# Patient Record
Sex: Male | Born: 1960 | Race: White | Hispanic: No | Marital: Single | Smoking: Current some day smoker
Health system: Southern US, Community
[De-identification: ages and names within clinical notes are randomized; demographics above are authoritative.]

## PROBLEM LIST (undated history)

## (undated) DIAGNOSIS — K5190 Ulcerative colitis, unspecified, without complications: Secondary | ICD-10-CM

## (undated) HISTORY — PX: INGUINAL HERNIA REPAIR: SUR1180

## (undated) HISTORY — PX: WISDOM TOOTH EXTRACTION: SHX21

---

## 1998-04-27 ENCOUNTER — Ambulatory Visit (HOSPITAL_BASED_OUTPATIENT_CLINIC_OR_DEPARTMENT_OTHER): Admission: RE | Admit: 1998-04-27 | Discharge: 1998-04-27 | Payer: Self-pay | Admitting: General Surgery

## 1999-08-03 ENCOUNTER — Encounter: Admission: RE | Admit: 1999-08-03 | Discharge: 1999-08-03 | Payer: Self-pay | Admitting: *Deleted

## 1999-08-03 ENCOUNTER — Encounter: Payer: Self-pay | Admitting: *Deleted

## 2013-04-03 ENCOUNTER — Ambulatory Visit (INDEPENDENT_AMBULATORY_CARE_PROVIDER_SITE_OTHER): Payer: BC Managed Care – PPO | Admitting: Emergency Medicine

## 2013-04-03 VITALS — BP 122/74 | HR 60 | Temp 98.0°F | Resp 17 | Ht 69.0 in | Wt 171.0 lb

## 2013-04-03 DIAGNOSIS — R361 Hematospermia: Secondary | ICD-10-CM

## 2013-04-03 DIAGNOSIS — S335XXA Sprain of ligaments of lumbar spine, initial encounter: Secondary | ICD-10-CM

## 2013-04-03 LAB — POCT UA - MICROSCOPIC ONLY
Bacteria, U Microscopic: NEGATIVE
Casts, Ur, LPF, POC: NEGATIVE
Crystals, Ur, HPF, POC: NEGATIVE
Epithelial cells, urine per micros: NEGATIVE
Mucus, UA: NEGATIVE
RBC, urine, microscopic: NEGATIVE
WBC, Ur, HPF, POC: NEGATIVE
Yeast, UA: NEGATIVE

## 2013-04-03 LAB — POCT CBC
Granulocyte percent: 61.3 %G (ref 37–80)
HCT, POC: 41.3 % — AB (ref 43.5–53.7)
Hemoglobin: 13 g/dL — AB (ref 14.1–18.1)
Lymph, poc: 2.3 (ref 0.6–3.4)
MCH, POC: 30.5 pg (ref 27–31.2)
MCHC: 31.5 g/dL — AB (ref 31.8–35.4)
MCV: 96.9 fL (ref 80–97)
MID (cbc): 0.5 (ref 0–0.9)
MPV: 8.8 fL (ref 0–99.8)
POC Granulocyte: 4.5 (ref 2–6.9)
POC LYMPH PERCENT: 31.5 %L (ref 10–50)
POC MID %: 7.2 %M (ref 0–12)
Platelet Count, POC: 183 10*3/uL (ref 142–424)
RBC: 4.26 M/uL — AB (ref 4.69–6.13)
RDW, POC: 12.6 %
WBC: 7.4 10*3/uL (ref 4.6–10.2)

## 2013-04-03 LAB — POCT URINALYSIS DIPSTICK
Bilirubin, UA: NEGATIVE
Blood, UA: NEGATIVE
Glucose, UA: NEGATIVE
Ketones, UA: NEGATIVE
Leukocytes, UA: NEGATIVE
Nitrite, UA: NEGATIVE
Protein, UA: NEGATIVE
Spec Grav, UA: 1.01
Urobilinogen, UA: 0.2
pH, UA: 7

## 2013-04-03 LAB — PSA: PSA: 1.74 ng/mL (ref <=4.00)

## 2013-04-03 MED ORDER — NAPROXEN SODIUM 550 MG PO TABS: 550.0000 mg | ORAL_TABLET | Freq: Two times a day (BID) | ORAL | Status: AC

## 2013-04-03 NOTE — Patient Instructions (Addendum)
Lumbosacral Strain Lumbosacral strain is one of the most common causes of back pain. There are many causes of back pain. Most are not serious conditions. CAUSES  Your backbone (spinal column) is made up of 24 main vertebral bodies, the sacrum, and the coccyx. These are held together by muscles and tough, fibrous tissue (ligaments). Nerve roots pass through the openings between the vertebrae. A sudden move or injury to the back may cause injury to, or pressure on, these nerves. This may result in localized back pain or pain movement (radiation) into the buttocks, down the leg, and into the foot. Sharp, shooting pain from the buttock down the back of the leg (sciatica) is frequently associated with a ruptured (herniated) disk. Pain may be caused by muscle spasm alone. Your caregiver can often find the cause of your pain by the details of your symptoms and an exam. In some cases, you may need tests (such as X-rays). Your caregiver will work with you to decide if any tests are needed based on your specific exam. HOME CARE INSTRUCTIONS   Avoid an underactive lifestyle. Active exercise, as directed by your caregiver, is your greatest weapon against back pain.  Avoid hard physical activities (tennis, racquetball, waterskiing) if you are not in proper physical condition for it. This may aggravate or create problems.  If you have a back problem, avoid sports requiring sudden body movements. Swimming and walking are generally safer activities.  Maintain good posture.  Avoid becoming overweight (obese).  Use bed rest for only the most extreme, sudden (acute) episode. Your caregiver will help you determine how much bed rest is necessary.  For acute conditions, you may put ice on the injured area.  Put ice in a plastic bag.  Place a towel between your skin and the bag.  Leave the ice on for 15-20 minutes at a time, every 2 hours, or as needed.  After you are improved and more active, it may help to  apply heat for 30 minutes before activities. See your caregiver if you are having pain that lasts longer than expected. Your caregiver can advise appropriate exercises or therapy if needed. With conditioning, most back problems can be avoided. SEEK IMMEDIATE MEDICAL CARE IF:   You have numbness, tingling, weakness, or problems with the use of your arms or legs.  You experience severe back pain not relieved with medicines.  There is a change in bowel or bladder control.  You have increasing pain in any area of the body, including your belly (abdomen).  You notice shortness of breath, dizziness, or feel faint.  You feel sick to your stomach (nauseous), are throwing up (vomiting), or become sweaty.  You notice discoloration of your toes or legs, or your feet get very cold.  Your back pain is getting worse.  You have a fever. MAKE SURE YOU:   Understand these instructions.  Will watch your condition.  Will get help right away if you are not doing well or get worse. Document Released: 06/12/2005 Document Revised: 11/25/2011 Document Reviewed: 12/02/2008 [facility] Patient Information 2014 [place], [place].

## 2013-04-03 NOTE — Progress Notes (Signed)
[facility] [address] [place] [address] [phone]  Date:  04/03/2013   Name:  Zachary Horn   DOB:  [birth year]   MRN:  [id number]  PCP:  No primary provider on file.    Chief Complaint: Back Pain and Nausea   History of Present Illness:  Zachary Horn is a 52 y.o. very pleasant male patient who presents with the following:  Noticed blood in semen a couple days ago.  Has low back pain and generalized malaise and muscle aches.  No fever or  Chills.  Some hesitancy and dribbling no dysuria or urgency.  Has frequent urination.  Normal stream.  Low back pain for past several weeks.  Not radiating and is located across lower back.    There are no active problems to display for this patient.   History reviewed. No pertinent past medical history.  History reviewed. No pertinent past surgical history.  History  Substance Use Topics  . Smoking status: Never Smoker   . Smokeless tobacco: Not on file  . Alcohol Use: No    History reviewed. No pertinent family history.  No Known Allergies  Medication list has been reviewed and updated.  No current outpatient prescriptions on file prior to visit.   No current facility-administered medications on file prior to visit.    Review of Systems:  As per HPI, otherwise negative.    Physical Examination: Filed Vitals:   04/03/13 1007  BP: 122/74  Pulse: 60  Temp: 98 F (36.7 C)  Resp: 17   Filed Vitals:   04/03/13 1007  Height: 5' 9"  (1.753 m)  Weight: 171 lb (77.565 kg)   Body mass index is 25.24 kg/(m^2). Ideal Body Weight: Weight in (lb) to have BMI = 25: 168.9   GEN: WDWN, NAD, Non-toxic, Alert & Oriented x 3 HEENT: Atraumatic, Normocephalic.  Ears and Nose: No external deformity. EXTR: No clubbing/cyanosis/edema NEURO: Normal gait.  PSYCH: Normally interactive. Conversant. Not depressed or anxious appearing.  Calm demeanor.  BACK  Lumbar tenderness   Assessment and  Plan: Hematospermia Lumbar strain Anaprox Flexeril   Signed,  Ellison Carwin, MD   Results for orders placed in visit on 04/03/13  POCT CBC      Result Value Range   WBC 7.4  4.6 - 10.2 K/uL   Lymph, poc 2.3  0.6 - 3.4   POC LYMPH PERCENT 31.5  10 - 50 %L   MID (cbc) 0.5  0 - 0.9   POC MID % 7.2  0 - 12 %M   POC Granulocyte 4.5  2 - 6.9   Granulocyte percent 61.3  37 - 80 %G   RBC 4.26 (*) 4.69 - 6.13 M/uL   Hemoglobin 13.0 (*) 14.1 - 18.1 g/dL   HCT, POC 41.3 (*) 43.5 - 53.7 %   MCV 96.9  80 - 97 fL   MCH, POC 30.5  27 - 31.2 pg   MCHC 31.5 (*) 31.8 - 35.4 g/dL   RDW, POC 12.6     Platelet Count, POC 183  142 - 424 K/uL   MPV 8.8  0 - 99.8 fL  POCT URINALYSIS DIPSTICK      Result Value Range   Color, UA yellow     Clarity, UA clear     Glucose, UA neg     Bilirubin, UA neg     Ketones, UA neg     Spec Grav, UA 1.010  Blood, UA neg     pH, UA 7.0     Protein, UA neg     Urobilinogen, UA 0.2     Nitrite, UA neg     Leukocytes, UA Negative    POCT UA - MICROSCOPIC ONLY      Result Value Range   WBC, Ur, HPF, POC neg     RBC, urine, microscopic neg     Bacteria, U Microscopic neg     Mucus, UA neg     Epithelial cells, urine per micros neg     Crystals, Ur, HPF, POC neg     Casts, Ur, LPF, POC neg     Yeast, UA neg

## 2013-05-01 ENCOUNTER — Ambulatory Visit (INDEPENDENT_AMBULATORY_CARE_PROVIDER_SITE_OTHER): Payer: BC Managed Care – PPO | Admitting: Family Medicine

## 2013-05-01 VITALS — BP 96/68 | HR 62 | Temp 98.6°F | Resp 16 | Ht 68.5 in | Wt 162.4 lb

## 2013-05-01 DIAGNOSIS — S91009A Unspecified open wound, unspecified ankle, initial encounter: Secondary | ICD-10-CM

## 2013-05-01 DIAGNOSIS — S81009A Unspecified open wound, unspecified knee, initial encounter: Secondary | ICD-10-CM

## 2013-05-01 DIAGNOSIS — M79609 Pain in unspecified limb: Secondary | ICD-10-CM

## 2013-05-01 DIAGNOSIS — Z23 Encounter for immunization: Secondary | ICD-10-CM

## 2013-05-01 DIAGNOSIS — S81801A Unspecified open wound, right lower leg, initial encounter: Secondary | ICD-10-CM

## 2013-05-01 NOTE — Patient Instructions (Addendum)

## 2013-05-01 NOTE — Progress Notes (Signed)
Procedure Note: Verbal consent obtained from the patient.  Local anesthesia with 2 cc Lidocaine 2% with epinephrine.  Wound scrubbed with soap and water.  Wound explored.  No foreign bodies or deep structure injury noted.  Wound closed with 5 sutures of 4-0 Prolene (#1 HM  + #4 SI).  Area cleansed and dressed.  Wound care discussed.  Pt tolerated very well.  Anticipate suture removal in 10 days.   Patient comes in with lacerated right foot after having cut it on a piece of scrap aluminum one half hours prior to arriving. He's unsure of his last tetanus shot.  Objective: Patient has 2 cm irregular laceration over the anterior surface of his right ankle. There is no involvement of vessels, tendons, or bone. He has full range of motion of his foot.  Please see procedure note above  Assessment: Simple laceration  Plan: Pain in limb  Wound of right leg, initial encounter - Plan: Tdap vaccine greater than or equal to 7yo IMreturn in 7 days for suture removal  Signed, Robyn Haber, MD

## 2013-05-12 ENCOUNTER — Ambulatory Visit (INDEPENDENT_AMBULATORY_CARE_PROVIDER_SITE_OTHER): Payer: BC Managed Care – PPO | Admitting: Physician Assistant

## 2013-05-12 VITALS — BP 98/60 | HR 60 | Temp 98.8°F | Resp 18 | Ht 68.5 in | Wt 166.0 lb

## 2013-05-12 DIAGNOSIS — Z4802 Encounter for removal of sutures: Secondary | ICD-10-CM

## 2013-05-12 NOTE — Progress Notes (Signed)
  Subjective:    Patient ID: Zachary Horn, male    DOB: Jun 30, 1961, 52 y.o.   MRN: [id number]  HPI   Zachary Horn is a very pleasant 52 yr old male here for suture removal.  Reports he is doing well.  No concerns.    Review of Systems  All other systems reviewed and are negative.       Objective:   Physical Exam  Vitals reviewed. Constitutional: He is oriented to person, place, and time. He appears well-developed and well-nourished. No distress.  HENT:  Head: Normocephalic and atraumatic.  Eyes: Conjunctivae are normal. No scleral icterus.  Pulmonary/Chest: Effort normal.  Neurological: He is alert and oriented to person, place, and time.  Skin: Skin is warm and dry.     Healing laceration of right ankle; sutures removed with minimal difficulty though one suture was scabbed over; mupirocin and bandage applied        Assessment & Plan:  Visit for suture removal   Zachary Horn is a very pleasant 52 yr old male here for suture removal.  Sutures removed as above.  Continue daily dressing until completely healed.  Follow up prn

## 2016-03-06 DIAGNOSIS — J301 Allergic rhinitis due to pollen: Secondary | ICD-10-CM | POA: Diagnosis not present

## 2016-06-13 DIAGNOSIS — Z0000 Encounter for general adult medical examination without abnormal findings: Secondary | ICD-10-CM | POA: Diagnosis not present

## 2016-06-13 DIAGNOSIS — Z125 Encounter for screening for malignant neoplasm of prostate: Secondary | ICD-10-CM | POA: Diagnosis not present

## 2016-06-20 DIAGNOSIS — J45909 Unspecified asthma, uncomplicated: Secondary | ICD-10-CM | POA: Diagnosis not present

## 2016-06-20 DIAGNOSIS — R740 Nonspecific elevation of levels of transaminase and lactic acid dehydrogenase [LDH]: Secondary | ICD-10-CM | POA: Diagnosis not present

## 2016-06-20 DIAGNOSIS — Z0000 Encounter for general adult medical examination without abnormal findings: Secondary | ICD-10-CM | POA: Diagnosis not present

## 2016-07-19 ENCOUNTER — Ambulatory Visit: Payer: Self-pay

## 2016-07-19 ENCOUNTER — Encounter: Payer: Self-pay | Admitting: Sports Medicine

## 2016-07-19 ENCOUNTER — Ambulatory Visit (INDEPENDENT_AMBULATORY_CARE_PROVIDER_SITE_OTHER): Payer: BLUE CROSS/BLUE SHIELD | Admitting: Sports Medicine

## 2016-07-19 VITALS — BP 116/90 | HR 50 | Ht 69.0 in | Wt 175.0 lb

## 2016-07-19 DIAGNOSIS — M79671 Pain in right foot: Secondary | ICD-10-CM

## 2016-07-19 NOTE — Assessment & Plan Note (Signed)
It is possible that his pain could be associated with early plantar fasciitis versus a bone contusion of his calcaneus. - Placed in size 10-11 greens sport insoles today with a medium scaphoid pad - He was also given a midfoot strap. - He is to follow-up in 3-4 weeks. If there is improvement of his symptoms could consider custom orthotics at that time.

## 2016-07-19 NOTE — Progress Notes (Signed)
  Zachary Horn - 55 y.o. male MRN [id number]  Date of birth: June 23, 1961  SUBJECTIVE:  Including CC & ROS.  Chief Complaint  Patient presents with  . Foot Pain   Zachary Horn is a 55 year old male that is presenting with right heel pain. He reports this pain started about 4 weeks ago. He denies any inciting event. He feels the pain when he is close to the end of the 3 mile run. He denies pain that is significantly with the first few steps in the morning. He denies any swelling or bruising. He describes the pain as mild in nature. He has taken ibuprofen for the pain. He denies any prior surgery on his foot. The pain is localized. He has not changed his shoes, tried new running surfaces, or increased his mileage. He reports a distant history of trauma to his right heel and it feels similar to that.  ROS: No unexpected weight loss, fever, chills, swelling, instability, muscle pain, numbness/tingling, redness, otherwise see HPI    HISTORY: Past Medical, Surgical, Social, and Family History Reviewed & Updated per EMR.   Pertinent Historical Findings include: PMSHx -  Hernia repair  PSHx -  Smoke cigars occasionally. Painter  FHx -  None  Medications - none   DATA REVIEWED: None to review   PHYSICAL EXAM:  VS: BP:116/90  HR:(!) 50bpm  TEMP: ( )  RESP:   HT:5' 9"  (175.3 cm)   WT:175 lb (79.4 kg)  BMI:25.9 PHYSICAL EXAM: Gen: NAD, alert, cooperative with exam, well-appearing HEENT: clear conjunctiva, EOMI CV:  no edema, capillary refill brisk,  Resp: non-labored, normal speech Skin: no rashes, normal turgor  Neuro: no gross deficits.  Psych:  alert and oriented Right foot: Tenderness to palpation over the medial calcaneus. No overlying ecchymosis or swelling. No tenderness palpation of the insertion of the acuities. No tears palpation of the tarsal tunnel. Normal range of motion. Normal strength. Negative calcaneal squeeze. The transverse and longitudinal arch seem preserved. No  abnormal calcification formation on his plantar aspect. Gait: He has a fairly neutral running gait and lands on his midfoot  Limited ultrasound: Right foot: The calcaneus did not reveal any hypervascularity to suggest a stress fracture. There did not show a significant spur of the calcaneus. The plantar fascia appears to be normal width at 0.5 mm at the origin. The achilless tendon had some calcific changes near the insertion. These findings are consistent with a normal exam.  ASSESSMENT & PLAN:   Pain of right heel It is possible that his pain could be associated with early plantar fasciitis versus a bone contusion of his calcaneus. - Placed in size 10-11 greens sport insoles today with a medium scaphoid pad - He was also given a midfoot strap. - He is to follow-up in 3-4 weeks. If there is improvement of his symptoms could consider custom orthotics at that time.

## 2016-08-15 DIAGNOSIS — K649 Unspecified hemorrhoids: Secondary | ICD-10-CM | POA: Diagnosis not present

## 2016-08-19 ENCOUNTER — Ambulatory Visit (INDEPENDENT_AMBULATORY_CARE_PROVIDER_SITE_OTHER): Payer: BLUE CROSS/BLUE SHIELD | Admitting: Sports Medicine

## 2016-08-19 ENCOUNTER — Other Ambulatory Visit: Payer: Self-pay | Admitting: *Deleted

## 2016-08-19 ENCOUNTER — Encounter: Payer: Self-pay | Admitting: Sports Medicine

## 2016-08-19 VITALS — BP 104/74 | HR 55 | Ht 68.0 in | Wt 170.0 lb

## 2016-08-19 DIAGNOSIS — M79671 Pain in right foot: Secondary | ICD-10-CM

## 2016-08-19 NOTE — Progress Notes (Signed)
   Subjective:    Patient ID: Zachary Horn, male    DOB: 1961-04-06, 55 y.o.   MRN: [id number]  HPI    Patient comes in today for follow-up on right heel pain. He has improved quite a bit. He likes his green sports insoles. He has been using his midfoot strap and icing after activity. He has been able to return to running and golfing with very little pain. He is happy with his progress to date.    Review of Systems     Objective:   Physical Exam  Well-developed, well-nourished. No acute distress.  Right foot: There is no tenderness to palpation at the origin of the plantar fascia. Negative calcaneal squeeze. Neurovascularly intact distally. Walking without a limp.      Assessment & Plan:   Resolving right heel pain secondary to early plantar fasciitis versus possible bone contusion  Patient is doing very well. He may discontinue his arch strap if he would like. However, he needs to continue with his green sports insoles and scaphoid pads. We had previously discussed custom orthotics but the patient is doing so well currently that we will wait on those for now. If he finds that he is wearing through his green sports insoles then we could reconsider custom orthotics at a later date. He may continue to increase his activity as tolerated and will follow-up with me as needed.

## 2016-10-23 DIAGNOSIS — K648 Other hemorrhoids: Secondary | ICD-10-CM | POA: Diagnosis not present

## 2016-10-23 DIAGNOSIS — K644 Residual hemorrhoidal skin tags: Secondary | ICD-10-CM | POA: Diagnosis not present

## 2017-05-12 DIAGNOSIS — K529 Noninfective gastroenteritis and colitis, unspecified: Secondary | ICD-10-CM | POA: Diagnosis not present

## 2017-05-28 DIAGNOSIS — R197 Diarrhea, unspecified: Secondary | ICD-10-CM | POA: Diagnosis not present

## 2017-07-10 DIAGNOSIS — Z125 Encounter for screening for malignant neoplasm of prostate: Secondary | ICD-10-CM | POA: Diagnosis not present

## 2017-07-10 DIAGNOSIS — Z0000 Encounter for general adult medical examination without abnormal findings: Secondary | ICD-10-CM | POA: Diagnosis not present

## 2017-07-17 DIAGNOSIS — Z0000 Encounter for general adult medical examination without abnormal findings: Secondary | ICD-10-CM | POA: Diagnosis not present

## 2017-07-17 DIAGNOSIS — Z23 Encounter for immunization: Secondary | ICD-10-CM | POA: Diagnosis not present

## 2017-07-17 DIAGNOSIS — Z87448 Personal history of other diseases of urinary system: Secondary | ICD-10-CM | POA: Diagnosis not present

## 2017-07-17 DIAGNOSIS — R319 Hematuria, unspecified: Secondary | ICD-10-CM | POA: Diagnosis not present

## 2018-02-18 DIAGNOSIS — K4090 Unilateral inguinal hernia, without obstruction or gangrene, not specified as recurrent: Secondary | ICD-10-CM | POA: Diagnosis not present

## 2018-02-18 DIAGNOSIS — J45909 Unspecified asthma, uncomplicated: Secondary | ICD-10-CM | POA: Diagnosis not present

## 2018-02-23 ENCOUNTER — Ambulatory Visit: Payer: BLUE CROSS/BLUE SHIELD | Admitting: Sports Medicine

## 2018-02-23 ENCOUNTER — Encounter: Payer: Self-pay | Admitting: Sports Medicine

## 2018-02-23 VITALS — BP 101/69 | Ht 69.0 in | Wt 160.0 lb

## 2018-02-23 DIAGNOSIS — S39011A Strain of muscle, fascia and tendon of abdomen, initial encounter: Secondary | ICD-10-CM | POA: Diagnosis not present

## 2018-02-24 DIAGNOSIS — K529 Noninfective gastroenteritis and colitis, unspecified: Secondary | ICD-10-CM | POA: Diagnosis not present

## 2018-02-24 NOTE — Progress Notes (Signed)
   Subjective:    Patient ID: Zachary Horn, male    DOB: 09-Nov-1960, 57 y.o.   MRN: [id number]  HPI chief complaint: Right lower abdominal pain  Very pleasant 57 year old male comes in today with concerns about a new hernia. Patient had a right-sided hernia repair 2 years ago and was doing well up until he was moving a desk 2 weeks ago. Felt a pulling sensation in the right inguinal area and is now concerned that he may have a new hernia. He has not noticed any bulging. He does endorse pain with any sort of activity that requires him to contract his lower abdominal musculature. He get some radiating pain to the back as well.He enjoys running and has been able to run with minimal discomfort. He has an appointment with his primary care physician's office later this week.  Interm will history reviewed Medications reviewed Allergies reviewed    Review of Systems As above    Objective:   Physical Exam  Well-developed, well-nourished. No acute distress. Awake alert and oriented 3. Vital signs reviewed  Abdominal exam does not show any palpable defect in the lower abdominal wall. There is tenderness to palpation along the right lower rectus abdominous with reproducible pain with resisted sit up. Genitalia exam does not reveal evidence of hernia.      Assessment & Plan:   Right-sided lower abdominal pain likely secondary to muscular strain  Although I do not see any obvious evidence of a hernia, I've encouraged him to see his primary care physician later this week as scheduled. Patient understands. I think he is safe to continue with activity as tolerated, including running. Will follow-up with me as needed.

## 2018-02-26 DIAGNOSIS — R197 Diarrhea, unspecified: Secondary | ICD-10-CM | POA: Diagnosis not present

## 2018-02-26 DIAGNOSIS — K529 Noninfective gastroenteritis and colitis, unspecified: Secondary | ICD-10-CM | POA: Diagnosis not present

## 2018-03-13 ENCOUNTER — Emergency Department (HOSPITAL_COMMUNITY)
Admission: EM | Admit: 2018-03-13 | Discharge: 2018-03-13 | Disposition: A | Discharge status: Home / Self Care | Payer: BLUE CROSS/BLUE SHIELD | Attending: Emergency Medicine | Admitting: Emergency Medicine

## 2018-03-13 ENCOUNTER — Encounter (HOSPITAL_COMMUNITY): Payer: Self-pay

## 2018-03-13 ENCOUNTER — Other Ambulatory Visit: Payer: Self-pay

## 2018-03-13 DIAGNOSIS — G479 Sleep disorder, unspecified: Secondary | ICD-10-CM | POA: Insufficient documentation

## 2018-03-13 DIAGNOSIS — R197 Diarrhea, unspecified: Secondary | ICD-10-CM | POA: Insufficient documentation

## 2018-03-13 DIAGNOSIS — K529 Noninfective gastroenteritis and colitis, unspecified: Secondary | ICD-10-CM | POA: Diagnosis not present

## 2018-03-13 LAB — I-STAT CHEM 8, ED
BUN: 5 mg/dL — ABNORMAL LOW (ref 6–20)
CHLORIDE: 87 mmol/L — AB (ref 98–111)
CREATININE: 0.8 mg/dL (ref 0.61–1.24)
Calcium, Ion: 1.07 mmol/L — ABNORMAL LOW (ref 1.15–1.40)
GLUCOSE: 152 mg/dL — AB (ref 70–99)
HCT: 33 % — ABNORMAL LOW (ref 39.0–52.0)
Hemoglobin: 11.2 g/dL — ABNORMAL LOW (ref 13.0–17.0)
POTASSIUM: 3 mmol/L — AB (ref 3.5–5.1)
Sodium: 128 mmol/L — ABNORMAL LOW (ref 135–145)
TCO2: 28 mmol/L (ref 22–32)

## 2018-03-13 LAB — CBC WITH DIFFERENTIAL/PLATELET
BASOS ABS: 0.1 10*3/uL (ref 0.0–0.1)
Basophils Relative: 1 %
Eosinophils Absolute: 0.2 10*3/uL (ref 0.0–0.7)
Eosinophils Relative: 2 %
HEMATOCRIT: 30.8 % — AB (ref 39.0–52.0)
HEMOGLOBIN: 10.8 g/dL — AB (ref 13.0–17.0)
LYMPHS PCT: 17 %
Lymphs Abs: 2 10*3/uL (ref 0.7–4.0)
MCH: 31 pg (ref 26.0–34.0)
MCHC: 35.1 g/dL (ref 30.0–36.0)
MCV: 88.5 fL (ref 78.0–100.0)
MONO ABS: 1.6 10*3/uL (ref 0.1–1.0)
MONOS PCT: 13 %
NEUTROS ABS: 7.9 10*3/uL (ref 1.7–7.7)
Neutrophils Relative %: 67 %
Platelets: 600 10*3/uL — ABNORMAL HIGH (ref 150–400)
RBC: 3.48 MIL/uL — ABNORMAL LOW (ref 4.22–5.81)
RDW: 12.2 % (ref 11.5–15.5)
WBC: 11.7 10*3/uL — ABNORMAL HIGH (ref 4.0–10.5)

## 2018-03-13 LAB — MAGNESIUM: MAGNESIUM: 2.1 mg/dL (ref 1.7–2.4)

## 2018-03-13 MED ORDER — LORAZEPAM 1 MG PO TABS: 1.0000 mg | ORAL_TABLET | Freq: Every day | ORAL | 0 refills | Status: DC

## 2018-03-13 MED ORDER — POTASSIUM CHLORIDE CRYS ER 20 MEQ PO TBCR
40.0000 meq | EXTENDED_RELEASE_TABLET | Freq: Once | ORAL | Status: AC
  Administered 2018-03-13: 40 meq via ORAL
  Filled 2018-03-13: qty 2

## 2018-03-13 MED ORDER — SODIUM CHLORIDE 0.9 % IV BOLUS
1000.0000 mL | Freq: Once | INTRAVENOUS | Status: AC
  Administered 2018-03-13: 1000 mL via INTRAVENOUS

## 2018-03-13 NOTE — ED Provider Notes (Signed)
[facility] DEPT Provider Note   CSN: [id number] Arrival date & time: 03/13/18  0503     History   Chief Complaint Chief Complaint  Patient presents with  . Insomnia  . Diarrhea    HPI Zachary Horn is a 57 y.o. male.  HPI 57 year old male comes in with chief complaint of diarrhea and insomnia.  Patient states that for the past 3 weeks he has been having diarrhea, about 15 episodes of loose bowel movements every day.  Patient has seen his PCP, and is supposed to see GI this Monday.  He states that he started taking Lomotil last week to help with his diarrhea.  He took the medication for 3 or 4 days and started noticing that he was having restlessness and difficulty in sleeping.  Patient stopped taking his medication 4 days ago, but he continues to have difficulty in sleeping, and when he is asleep he is having with dreams.  Patient denies any recent antibiotic use, travel hx.  There is no bloody stools.  Review of system is positive for jerking and cramping, the former specifically when he is trying to fall asleep.  Patient does not have any significant medical history and denies any substance abuse.   History reviewed. No pertinent past medical history.  Patient Active Problem List   Diagnosis Date Noted  . Pain of right heel 07/19/2016    History reviewed. No pertinent surgical history.      Home Medications    Prior to Admission medications   Medication Sig Start Date End Date Taking? Authorizing Provider  ibuprofen (ADVIL,MOTRIN) 200 MG tablet Take 200 mg by mouth every 6 (six) hours as needed for moderate pain.    Yes [provider]  LORazepam (ATIVAN) 1 MG tablet Take 1 tablet (1 mg total) by mouth at bedtime. 03/13/18   Varney Biles, MD    Family History History reviewed. No pertinent family history.  Social History Social History   Tobacco Use  . Smoking status: Never Smoker  . Smokeless tobacco: Never Used    Substance Use Topics  . Alcohol use: No  . Drug use: No     Allergies   Patient has no known allergies.   Review of Systems Review of Systems  Constitutional: Positive for activity change.  Respiratory: Negative for shortness of breath.   Cardiovascular: Negative for chest pain and palpitations.  Gastrointestinal: Positive for diarrhea.  Allergic/Immunologic: Negative for immunocompromised state.  All other systems reviewed and are negative.    Physical Exam Updated Vital Signs BP 101/73 (BP Location: Left Arm)   Pulse (!) 108   Temp 97.7 F (36.5 C) (Oral)   Resp 16   Ht 5' 9"  (1.753 m)   Wt 70.3 kg (155 lb)   SpO2 100%   BMI 22.89 kg/m   Physical Exam  Constitutional: He is oriented to person, place, and time. He appears well-developed.  HENT:  Head: Atraumatic.  Neck: Neck supple.  Cardiovascular: Normal rate.  Pulmonary/Chest: Effort normal.  Abdominal: Soft.  Neurological: He is alert and oriented to person, place, and time. He displays normal reflexes. No cranial nerve deficit or sensory deficit. Coordination normal.  Skin: Skin is warm.  Nursing note and vitals reviewed.    ED Treatments / Results  Labs (all labs ordered are listed, but only abnormal results are displayed) Labs Reviewed  CBC WITH DIFFERENTIAL/PLATELET - Abnormal; Notable for the following components:      Result Value  WBC 11.7 (*)    RBC 3.48 (*)    Hemoglobin 10.8 (*)    HCT 30.8 (*)    Platelets 600 (*)    All other components within normal limits  I-STAT CHEM 8, ED - Abnormal; Notable for the following components:   Sodium 128 (*)    Potassium 3.0 (*)    Chloride 87 (*)    BUN 5 (*)    Glucose, Bld 152 (*)    Calcium, Ion 1.07 (*)    Hemoglobin 11.2 (*)    HCT 33.0 (*)    All other components within normal limits  MAGNESIUM    EKG None  Radiology No results found.  Procedures Procedures (including critical care time)  Medications Ordered in  ED Medications  potassium chloride SA (K-DUR,KLOR-CON) CR tablet 40 mEq (has no administration in time range)  sodium chloride 0.9 % bolus 1,000 mL (has no administration in time range)  potassium chloride SA (K-DUR,KLOR-CON) CR tablet 40 mEq (has no administration in time range)     Initial Impression / Assessment and Plan / ED Course  I have reviewed the triage vital signs and the nursing notes.  Pertinent labs & imaging results that were available during my care of the patient were reviewed by me and considered in my medical decision making (see chart for details).     57 year old healthy male with 3 weeks of diarrhea, comes in complaining of insomnia.  Patient states that his symptoms started after he started taking Lomotil.  He has stopped taking the medication for the last 4 days, however continues to have insomnia.  Additionally he is having cramps.  On exam patient does not appear dehydrated.  Basic labs have been ordered and he has mild hyponatremia and mild hypokalemia.  Magnesium level is normal.  We will give him a normal saline bolus along with oral potassium.  Patient is to see his GI doctor soon, which is reassuring.  I doubt that patient's insomnia is because of Lomotil, because he stopped taking the medication 4 days ago and he only took the medication for 5 days total.  Diarrhea appears to be secretory in nature and not infectious.  Plan is for patient to continue seeing the GI doctor for his diarrhea and his PCP for further management.  We will give him few Ativan pills that he can take at nighttime if he is feeling restless and having difficulty in sleeping.  Patient aware that the work-up in the emergency room is limited to emergent conditions only, and that follow-up with PCP is prudent.  Final Clinical Impressions(s) / ED Diagnoses   Final diagnoses:  Sleep disturbance  Chronic diarrhea    ED Discharge Orders        Ordered    LORazepam (ATIVAN) 1 MG tablet   Daily at bedtime     03/13/18 0730       Varney Biles, MD 03/13/18 [phone]

## 2018-03-13 NOTE — ED Triage Notes (Signed)
Pt presents to ED from home for insomnia and diarrhea. Pt reports that he was put on lomotil for frequent diarrhea, but the medication has prevented him from sleeping since Sunday. Pt reports he feels very "jittery" and can't relax.

## 2018-03-13 NOTE — Discharge Instructions (Signed)
You were seen in the ER for sleep disturbance. We are sending you home with Ativan that you can take only at nighttime, if unable to sleep. We want to see your primary care doctor and the GI doctor for continued management for of diarrhea.  In the ER, your potassium was slightly low and we replaced it. Avoid taking any medications for diarrhea for now, and ask GI team to help you manage her diarrhea better.

## 2018-03-16 DIAGNOSIS — D649 Anemia, unspecified: Secondary | ICD-10-CM | POA: Diagnosis not present

## 2018-03-16 DIAGNOSIS — R110 Nausea: Secondary | ICD-10-CM | POA: Diagnosis not present

## 2018-03-16 DIAGNOSIS — R634 Abnormal weight loss: Secondary | ICD-10-CM | POA: Diagnosis not present

## 2018-03-16 DIAGNOSIS — R197 Diarrhea, unspecified: Secondary | ICD-10-CM | POA: Diagnosis not present

## 2018-03-18 DIAGNOSIS — K219 Gastro-esophageal reflux disease without esophagitis: Secondary | ICD-10-CM | POA: Diagnosis not present

## 2018-03-18 DIAGNOSIS — B379 Candidiasis, unspecified: Secondary | ICD-10-CM | POA: Diagnosis not present

## 2018-03-18 DIAGNOSIS — K5289 Other specified noninfective gastroenteritis and colitis: Secondary | ICD-10-CM | POA: Diagnosis not present

## 2018-03-18 DIAGNOSIS — R110 Nausea: Secondary | ICD-10-CM | POA: Diagnosis not present

## 2018-03-18 DIAGNOSIS — K222 Esophageal obstruction: Secondary | ICD-10-CM | POA: Diagnosis not present

## 2018-03-18 DIAGNOSIS — K2980 Duodenitis without bleeding: Secondary | ICD-10-CM | POA: Diagnosis not present

## 2018-03-18 DIAGNOSIS — K51011 Ulcerative (chronic) pancolitis with rectal bleeding: Secondary | ICD-10-CM | POA: Diagnosis not present

## 2018-03-18 DIAGNOSIS — K625 Hemorrhage of anus and rectum: Secondary | ICD-10-CM | POA: Diagnosis not present

## 2018-03-18 DIAGNOSIS — K2930 Chronic superficial gastritis without bleeding: Secondary | ICD-10-CM | POA: Diagnosis not present

## 2018-03-18 DIAGNOSIS — R634 Abnormal weight loss: Secondary | ICD-10-CM | POA: Diagnosis not present

## 2018-03-18 DIAGNOSIS — K228 Other specified diseases of esophagus: Secondary | ICD-10-CM | POA: Diagnosis not present

## 2018-03-18 DIAGNOSIS — K649 Unspecified hemorrhoids: Secondary | ICD-10-CM | POA: Diagnosis not present

## 2018-03-18 DIAGNOSIS — K229 Disease of esophagus, unspecified: Secondary | ICD-10-CM | POA: Diagnosis not present

## 2018-03-18 DIAGNOSIS — D509 Iron deficiency anemia, unspecified: Secondary | ICD-10-CM | POA: Diagnosis not present

## 2018-03-24 DIAGNOSIS — K51011 Ulcerative (chronic) pancolitis with rectal bleeding: Secondary | ICD-10-CM | POA: Diagnosis not present

## 2018-03-25 DIAGNOSIS — B379 Candidiasis, unspecified: Secondary | ICD-10-CM | POA: Diagnosis not present

## 2018-03-25 DIAGNOSIS — K2980 Duodenitis without bleeding: Secondary | ICD-10-CM | POA: Diagnosis not present

## 2018-03-25 DIAGNOSIS — K5289 Other specified noninfective gastroenteritis and colitis: Secondary | ICD-10-CM | POA: Diagnosis not present

## 2018-03-25 DIAGNOSIS — K51011 Ulcerative (chronic) pancolitis with rectal bleeding: Secondary | ICD-10-CM | POA: Diagnosis not present

## 2018-03-30 ENCOUNTER — Ambulatory Visit
Admission: RE | Admit: 2018-03-30 | Discharge: 2018-03-30 | Disposition: A | Discharge status: Home / Self Care | Payer: BLUE CROSS/BLUE SHIELD | Source: Ambulatory Visit | Attending: Gastroenterology | Admitting: Gastroenterology

## 2018-03-30 ENCOUNTER — Other Ambulatory Visit: Payer: Self-pay | Admitting: Gastroenterology

## 2018-03-30 DIAGNOSIS — K51011 Ulcerative (chronic) pancolitis with rectal bleeding: Secondary | ICD-10-CM

## 2018-03-30 DIAGNOSIS — R0602 Shortness of breath: Secondary | ICD-10-CM | POA: Diagnosis not present

## 2018-04-13 DIAGNOSIS — K51011 Ulcerative (chronic) pancolitis with rectal bleeding: Secondary | ICD-10-CM | POA: Diagnosis not present

## 2018-04-17 ENCOUNTER — Inpatient Hospital Stay (HOSPITAL_COMMUNITY)
Admission: EM | Admit: 2018-04-17 | Discharge: 2018-04-24 | DRG: 385 | Disposition: A | Discharge status: Home / Self Care | Payer: BLUE CROSS/BLUE SHIELD | Attending: Internal Medicine | Admitting: Internal Medicine

## 2018-04-17 ENCOUNTER — Inpatient Hospital Stay (HOSPITAL_COMMUNITY): Payer: BLUE CROSS/BLUE SHIELD

## 2018-04-17 ENCOUNTER — Encounter (HOSPITAL_COMMUNITY): Payer: Self-pay

## 2018-04-17 ENCOUNTER — Other Ambulatory Visit: Payer: Self-pay

## 2018-04-17 DIAGNOSIS — I951 Orthostatic hypotension: Secondary | ICD-10-CM | POA: Diagnosis not present

## 2018-04-17 DIAGNOSIS — R55 Syncope and collapse: Secondary | ICD-10-CM | POA: Diagnosis not present

## 2018-04-17 DIAGNOSIS — I82612 Acute embolism and thrombosis of superficial veins of left upper extremity: Secondary | ICD-10-CM | POA: Diagnosis not present

## 2018-04-17 DIAGNOSIS — K5100 Ulcerative (chronic) pancolitis without complications: Secondary | ICD-10-CM | POA: Diagnosis not present

## 2018-04-17 DIAGNOSIS — R0602 Shortness of breath: Secondary | ICD-10-CM | POA: Diagnosis not present

## 2018-04-17 DIAGNOSIS — E876 Hypokalemia: Secondary | ICD-10-CM | POA: Diagnosis not present

## 2018-04-17 DIAGNOSIS — R578 Other shock: Secondary | ICD-10-CM | POA: Diagnosis not present

## 2018-04-17 DIAGNOSIS — I9589 Other hypotension: Secondary | ICD-10-CM | POA: Diagnosis not present

## 2018-04-17 DIAGNOSIS — D649 Anemia, unspecified: Secondary | ICD-10-CM | POA: Diagnosis not present

## 2018-04-17 DIAGNOSIS — I82A11 Acute embolism and thrombosis of right axillary vein: Secondary | ICD-10-CM | POA: Diagnosis not present

## 2018-04-17 DIAGNOSIS — E440 Moderate protein-calorie malnutrition: Secondary | ICD-10-CM

## 2018-04-17 DIAGNOSIS — E871 Hypo-osmolality and hyponatremia: Secondary | ICD-10-CM | POA: Diagnosis not present

## 2018-04-17 DIAGNOSIS — R6510 Systemic inflammatory response syndrome (SIRS) of non-infectious origin without acute organ dysfunction: Secondary | ICD-10-CM | POA: Diagnosis present

## 2018-04-17 DIAGNOSIS — D62 Acute posthemorrhagic anemia: Secondary | ICD-10-CM | POA: Diagnosis present

## 2018-04-17 DIAGNOSIS — I340 Nonrheumatic mitral (valve) insufficiency: Secondary | ICD-10-CM | POA: Diagnosis not present

## 2018-04-17 DIAGNOSIS — R579 Shock, unspecified: Secondary | ICD-10-CM | POA: Diagnosis not present

## 2018-04-17 DIAGNOSIS — E861 Hypovolemia: Secondary | ICD-10-CM | POA: Diagnosis present

## 2018-04-17 DIAGNOSIS — R571 Hypovolemic shock: Secondary | ICD-10-CM | POA: Diagnosis not present

## 2018-04-17 DIAGNOSIS — R001 Bradycardia, unspecified: Secondary | ICD-10-CM | POA: Diagnosis not present

## 2018-04-17 DIAGNOSIS — E860 Dehydration: Secondary | ICD-10-CM | POA: Diagnosis not present

## 2018-04-17 DIAGNOSIS — R634 Abnormal weight loss: Secondary | ICD-10-CM

## 2018-04-17 DIAGNOSIS — K51018 Ulcerative (chronic) pancolitis with other complication: Secondary | ICD-10-CM | POA: Diagnosis not present

## 2018-04-17 DIAGNOSIS — K51011 Ulcerative (chronic) pancolitis with rectal bleeding: Secondary | ICD-10-CM

## 2018-04-17 DIAGNOSIS — K922 Gastrointestinal hemorrhage, unspecified: Secondary | ICD-10-CM | POA: Diagnosis not present

## 2018-04-17 DIAGNOSIS — Z6823 Body mass index (BMI) 23.0-23.9, adult: Secondary | ICD-10-CM | POA: Diagnosis not present

## 2018-04-17 DIAGNOSIS — R627 Adult failure to thrive: Secondary | ICD-10-CM

## 2018-04-17 DIAGNOSIS — R42 Dizziness and giddiness: Secondary | ICD-10-CM | POA: Diagnosis not present

## 2018-04-17 DIAGNOSIS — E2740 Unspecified adrenocortical insufficiency: Secondary | ICD-10-CM | POA: Diagnosis present

## 2018-04-17 DIAGNOSIS — K5190 Ulcerative colitis, unspecified, without complications: Secondary | ICD-10-CM | POA: Diagnosis not present

## 2018-04-17 DIAGNOSIS — E878 Other disorders of electrolyte and fluid balance, not elsewhere classified: Secondary | ICD-10-CM | POA: Diagnosis not present

## 2018-04-17 DIAGNOSIS — E43 Unspecified severe protein-calorie malnutrition: Secondary | ICD-10-CM

## 2018-04-17 DIAGNOSIS — R58 Hemorrhage, not elsewhere classified: Secondary | ICD-10-CM

## 2018-04-17 DIAGNOSIS — R609 Edema, unspecified: Secondary | ICD-10-CM | POA: Diagnosis not present

## 2018-04-17 DIAGNOSIS — R509 Fever, unspecified: Secondary | ICD-10-CM

## 2018-04-17 DIAGNOSIS — I959 Hypotension, unspecified: Secondary | ICD-10-CM | POA: Diagnosis not present

## 2018-04-17 HISTORY — DX: Ulcerative colitis, unspecified, without complications: K51.90

## 2018-04-17 LAB — CBC
HEMATOCRIT: 19.2 % — AB (ref 39.0–52.0)
HEMOGLOBIN: 6.2 g/dL — AB (ref 13.0–17.0)
MCH: 27.6 pg (ref 26.0–34.0)
MCHC: 32.3 g/dL (ref 30.0–36.0)
MCV: 85.3 fL (ref 78.0–100.0)
Platelets: 361 10*3/uL (ref 150–400)
RBC: 2.25 MIL/uL — AB (ref 4.22–5.81)
RDW: 13.4 % (ref 11.5–15.5)
WBC: 5.6 10*3/uL (ref 4.0–10.5)

## 2018-04-17 LAB — LACTIC ACID, PLASMA
LACTIC ACID, VENOUS: 1.7 mmol/L (ref 0.5–1.9)
Lactic Acid, Venous: 1.3 mmol/L (ref 0.5–1.9)

## 2018-04-17 LAB — URINALYSIS, ROUTINE W REFLEX MICROSCOPIC
Bilirubin Urine: NEGATIVE
GLUCOSE, UA: NEGATIVE mg/dL
Ketones, ur: NEGATIVE mg/dL
LEUKOCYTES UA: NEGATIVE
NITRITE: NEGATIVE
Protein, ur: NEGATIVE mg/dL
SPECIFIC GRAVITY, URINE: 1.003 — AB (ref 1.005–1.030)
pH: 6 (ref 5.0–8.0)

## 2018-04-17 LAB — PREPARE RBC (CROSSMATCH)

## 2018-04-17 LAB — COMPREHENSIVE METABOLIC PANEL
ALBUMIN: 1.7 g/dL — AB (ref 3.5–5.0)
ALT: 12 U/L (ref 0–44)
ANION GAP: 9 (ref 5–15)
AST: 15 U/L (ref 15–41)
Alkaline Phosphatase: 106 U/L (ref 38–126)
BILIRUBIN TOTAL: 0.6 mg/dL (ref 0.3–1.2)
BUN: 13 mg/dL (ref 6–20)
CALCIUM: 7.5 mg/dL — AB (ref 8.9–10.3)
CO2: 26 mmol/L (ref 22–32)
Chloride: 89 mmol/L — ABNORMAL LOW (ref 98–111)
Creatinine, Ser: 0.9 mg/dL (ref 0.61–1.24)
GFR calc Af Amer: >60 mL/min (ref >60)
GFR calc non Af Amer: >60 mL/min (ref >60)
GLUCOSE: 146 mg/dL — AB (ref 70–99)
Potassium: 4 mmol/L (ref 3.5–5.1)
SODIUM: 124 mmol/L — AB (ref 135–145)
TOTAL PROTEIN: 5.5 g/dL — AB (ref 6.5–8.1)

## 2018-04-17 LAB — HEMOGLOBIN AND HEMATOCRIT, BLOOD
HCT: 19.9 % — ABNORMAL LOW (ref 39.0–52.0)
HEMOGLOBIN: 6.7 g/dL — AB (ref 13.0–17.0)

## 2018-04-17 LAB — MRSA PCR SCREENING: MRSA by PCR: NEGATIVE

## 2018-04-17 LAB — POC OCCULT BLOOD, ED: Fecal Occult Bld: POSITIVE — AB

## 2018-04-17 LAB — MAGNESIUM: Magnesium: 1.7 mg/dL (ref 1.7–2.4)

## 2018-04-17 LAB — ABO/RH: ABO/RH(D): O POS

## 2018-04-17 LAB — PROCALCITONIN: Procalcitonin: 1.33 ng/mL

## 2018-04-17 MED ORDER — BOOST / RESOURCE BREEZE PO LIQD CUSTOM
1.0000 | Freq: Three times a day (TID) | ORAL | Status: DC
  Administered 2018-04-17 – 2018-04-20 (×7): 1 via ORAL

## 2018-04-17 MED ORDER — LACTATED RINGERS IV BOLUS
1000.0000 mL | Freq: Once | INTRAVENOUS | Status: AC
  Administered 2018-04-17: 1000 mL via INTRAVENOUS

## 2018-04-17 MED ORDER — AZATHIOPRINE 50 MG PO TABS
75.0000 mg | ORAL_TABLET | Freq: Every day | ORAL | Status: DC
  Administered 2018-04-18 – 2018-04-24 (×7): 75 mg via ORAL
  Filled 2018-04-17 (×7): qty 2

## 2018-04-17 MED ORDER — VANCOMYCIN HCL 10 G IV SOLR
1250.0000 mg | Freq: Once | INTRAVENOUS | Status: AC
  Administered 2018-04-17: 1250 mg via INTRAVENOUS
  Filled 2018-04-17: qty 1250

## 2018-04-17 MED ORDER — PIPERACILLIN-TAZOBACTAM 3.375 G IVPB
3.3750 g | Freq: Three times a day (TID) | INTRAVENOUS | Status: DC
  Administered 2018-04-17 – 2018-04-20 (×8): 3.375 g via INTRAVENOUS
  Filled 2018-04-17 (×8): qty 50

## 2018-04-17 MED ORDER — PHENYLEPHRINE HCL-NACL 10-0.9 MG/250ML-% IV SOLN: 0.0000 ug/min | INTRAVENOUS | Status: DC

## 2018-04-17 MED ORDER — NOREPINEPHRINE 4 MG/250ML-% IV SOLN
0.0000 ug/min | INTRAVENOUS | Status: DC
  Filled 2018-04-17: qty 250

## 2018-04-17 MED ORDER — VANCOMYCIN HCL IN DEXTROSE 750-5 MG/150ML-% IV SOLN
750.0000 mg | Freq: Two times a day (BID) | INTRAVENOUS | Status: DC
  Administered 2018-04-18 – 2018-04-20 (×5): 750 mg via INTRAVENOUS
  Filled 2018-04-17 (×5): qty 150

## 2018-04-17 MED ORDER — LACTATED RINGERS IV SOLN
INTRAVENOUS | Status: DC
  Administered 2018-04-17: 19:00:00 via INTRAVENOUS
  Administered 2018-04-18: 125 mL/h via INTRAVENOUS
  Administered 2018-04-18 – 2018-04-20 (×6): via INTRAVENOUS
  Administered 2018-04-20: 125 mL/h via INTRAVENOUS
  Administered 2018-04-21 (×2): via INTRAVENOUS
  Administered 2018-04-22: 125 mL/h via INTRAVENOUS
  Administered 2018-04-22: 01:00:00 via INTRAVENOUS

## 2018-04-17 MED ORDER — ACETAMINOPHEN 500 MG PO TABS
1000.0000 mg | ORAL_TABLET | Freq: Once | ORAL | Status: AC
  Administered 2018-04-17: 1000 mg via ORAL
  Filled 2018-04-17: qty 2

## 2018-04-17 MED ORDER — PHENYLEPHRINE HCL-NACL 10-0.9 MG/250ML-% IV SOLN
0.0000 ug/min | INTRAVENOUS | Status: DC
  Administered 2018-04-17: 60 ug/min via INTRAVENOUS
  Administered 2018-04-17: 20 ug/min via INTRAVENOUS
  Administered 2018-04-18: 50 ug/min via INTRAVENOUS
  Administered 2018-04-18: 40 ug/min via INTRAVENOUS
  Administered 2018-04-18: 30 ug/min via INTRAVENOUS
  Administered 2018-04-18 (×2): 20 ug/min via INTRAVENOUS
  Administered 2018-04-19: 60 ug/min via INTRAVENOUS
  Filled 2018-04-17 (×9): qty 250

## 2018-04-17 MED ORDER — SODIUM CHLORIDE 0.9% IV SOLUTION: Freq: Once | INTRAVENOUS | Status: DC

## 2018-04-17 MED ORDER — PIPERACILLIN-TAZOBACTAM 3.375 G IVPB 30 MIN
3.3750 g | Freq: Once | INTRAVENOUS | Status: AC
  Administered 2018-04-17: 3.375 g via INTRAVENOUS
  Filled 2018-04-17: qty 50

## 2018-04-17 MED ORDER — SODIUM CHLORIDE 0.9 % IV SOLN: 10.0000 mL/h | Freq: Once | INTRAVENOUS | Status: DC

## 2018-04-17 MED ORDER — ONDANSETRON HCL 4 MG/2ML IJ SOLN: 4.0000 mg | Freq: Four times a day (QID) | INTRAMUSCULAR | Status: DC | PRN

## 2018-04-17 MED ORDER — METHYLPREDNISOLONE SODIUM SUCC 125 MG IJ SOLR
80.0000 mg | Freq: Two times a day (BID) | INTRAMUSCULAR | Status: DC
  Administered 2018-04-17 – 2018-04-19 (×4): 80 mg via INTRAVENOUS
  Filled 2018-04-17 (×4): qty 2

## 2018-04-17 MED ORDER — DEXTROSE 5 % IV SOLN
0.0000 ug/min | INTRAVENOUS | Status: DC
  Administered 2018-04-17: 2 ug/min via INTRAVENOUS
  Filled 2018-04-17: qty 4

## 2018-04-17 MED ORDER — ONDANSETRON HCL 4 MG PO TABS: 4.0000 mg | ORAL_TABLET | Freq: Four times a day (QID) | ORAL | Status: DC | PRN

## 2018-04-17 MED ORDER — PREDNISONE 10 MG PO TABS: 10.0000 mg | ORAL_TABLET | Freq: Every day | ORAL | Status: DC

## 2018-04-17 MED ORDER — SODIUM CHLORIDE 0.9 % IV SOLN
INTRAVENOUS | Status: DC | PRN
  Administered 2018-04-17: 1000 mL via INTRAVENOUS
  Administered 2018-04-19: 250 mL via INTRAVENOUS

## 2018-04-17 MED ORDER — SODIUM CHLORIDE 0.9 % IV BOLUS
1000.0000 mL | Freq: Once | INTRAVENOUS | Status: AC
  Administered 2018-04-17: 1000 mL via INTRAVENOUS

## 2018-04-17 MED ORDER — AZATHIOPRINE 50 MG PO TABS: 75.0000 mg | ORAL_TABLET | Freq: Every day | ORAL | Status: DC

## 2018-04-17 MED ORDER — LACTATED RINGERS IV BOLUS
2000.0000 mL | Freq: Once | INTRAVENOUS | Status: AC
  Administered 2018-04-17: 2000 mL via INTRAVENOUS

## 2018-04-17 NOTE — ED Notes (Signed)
ED TO INPATIENT HANDOFF REPORT  Name/Age/Gender Zachary Horn 57 y.o. male  Code Status   Home/SNF/Other Home  Chief Complaint hypotension  Level of Care/Admitting Diagnosis ED Disposition    ED Disposition Condition [facility] Area: [place] [100102]  Level of Care: Telemetry [5]  Admit to tele based on following criteria: Other see comments  Comments: Gi bleed  Diagnosis: GI bleed [354562]  Admitting Physician: Elodia Florence [phone]  Attending Physician: Cephus Slater, A CALDWELL [phone]  Estimated length of stay: past midnight tomorrow  Certification:: I certify this patient will need inpatient services for at least 2 midnights  PT Class (Do Not Modify): Inpatient [101]  PT Acc Code (Do Not Modify): Private [1]       Medical History Past Medical History:  Diagnosis Date  . Ulcerative colitis ([place])     Allergies No Known Allergies  IV Location/Drains/Wounds Patient Lines/Drains/Airways Status   Active Line/Drains/Airways    Name:   Placement date:   Placement time:   Site:   Days:   Peripheral IV 04/17/18 Left Antecubital   04/17/18    -    Antecubital   less than 1   Peripheral IV 04/17/18 Right Antecubital   04/17/18    1502    Antecubital   less than 1          Labs/Imaging Results for orders placed or performed during the hospital encounter of 04/17/18 (from the past 48 hour(s))  CBC     Status: Abnormal   Collection Time: 04/17/18 12:49 PM  Result Value Ref Range   WBC 5.6 4.0 - 10.5 K/uL   RBC 2.25 (L) 4.22 - 5.81 MIL/uL   Hemoglobin 6.2 (LL) 13.0 - 17.0 g/dL    Comment: CRITICAL RESULT CALLED TO, READ BACK BY AND VERIFIED WITH: TALKINGTON,J. RN @1320  ON 8.2.19 BY NMCCOY    HCT 19.2 (L) 39.0 - 52.0 %   MCV 85.3 78.0 - 100.0 fL   MCH 27.6 26.0 - 34.0 pg   MCHC 32.3 30.0 - 36.0 g/dL   RDW 13.4 11.5 - 15.5 %   Platelets 361 150 - 400 K/uL    Comment: Performed at [facility],  [place] [address], [place],  [address]  Comprehensive metabolic panel     Status: Abnormal   Collection Time: 04/17/18 12:49 PM  Result Value Ref Range   Sodium 124 (L) 135 - 145 mmol/L   Potassium 4.0 3.5 - 5.1 mmol/L   Chloride 89 (L) 98 - 111 mmol/L   CO2 26 22 - 32 mmol/L   Glucose, Bld 146 (H) 70 - 99 mg/dL   BUN 13 6 - 20 mg/dL   Creatinine, Ser 0.90 0.61 - 1.24 mg/dL   Calcium 7.5 (L) 8.9 - 10.3 mg/dL   Total Protein 5.5 (L) 6.5 - 8.1 g/dL   Albumin 1.7 (L) 3.5 - 5.0 g/dL   AST 15 15 - 41 U/L   ALT 12 0 - 44 U/L   Alkaline Phosphatase 106 38 - 126 U/L   Total Bilirubin 0.6 0.3 - 1.2 mg/dL   GFR calc non Af Amer >60 >60 mL/min   GFR calc Af Amer >60 >60 mL/min    Comment: (NOTE) The eGFR has been calculated using the CKD EPI equation. This calculation has not been validated in all clinical situations. eGFR's persistently <60 mL/min signify possible Chronic Kidney Disease.    Anion gap 9 5 -  15    Comment: Performed at [facility], [place] [address] [place], [place] [address]  Type and screen     Status: None (Preliminary result)   Collection Time: 04/17/18  1:05 PM  Result Value Ref Range   ABO/RH(D) O POS    Antibody Screen NEG    Sample Expiration 04/20/2018    Unit Number [id number]    Blood Component Type RED CELLS,LR    Unit division 00    Status of Unit ISSUED    Transfusion Status OK TO TRANSFUSE    Crossmatch Result      Compatible Performed at [facility] [address] [place], [place] [address]    Unit Number [id number]    Blood Component Type RED CELLS,LR    Unit division 00    Status of Unit ALLOCATED    Transfusion Status OK TO TRANSFUSE    Crossmatch Result Compatible   ABO/Rh     Status: None   Collection Time: 04/17/18  1:05 PM  Result Value Ref Range   ABO/RH(D)      O POS Performed at [facility], [place] [address], [place], [place] [address]   POC occult blood, ED  Provider will collect     Status: Abnormal   Collection Time: 04/17/18  1:27 PM  Result Value Ref Range   Fecal Occult Bld POSITIVE (A) NEGATIVE  Prepare RBC     Status: None   Collection Time: 04/17/18  1:27 PM  Result Value Ref Range   Order Confirmation      ORDER PROCESSED BY BLOOD BANK Performed at [facility] [address] [place], [place] [address]    No results found.  Pending Labs [employer] (From admission, onward)   Start     Ordered   04/17/18 1249  Urinalysis, Routine w reflex microscopic  STAT,   R     04/17/18 1248   Signed and Held  HIV antibody (Routine Testing)  Once,   R     Signed and Held   Signed and Held  CBC  Tomorrow morning,   R     Signed and Held   Signed and Held  Basic metabolic panel  Tomorrow morning,   R     Signed and Held   Signed and Held  Magnesium  Tomorrow morning,   R     Signed and Held   Signed and Held  Hemoglobin and hematocrit, blood  Now then every 8 hours,   R     Signed and Held      Vitals/Pain Today's Vitals   04/17/18 1413 04/17/18 1430 04/17/18 1445 04/17/18 1500  BP: (!) 116/59 108/74 135/83 93/65  Pulse: 95 99 (!) 117 94  Resp: 18 20 (!) 22 19  Temp: 99.8 F (37.7 C) (!) 101 F (38.3 C)  99 F (37.2 C)  TempSrc: Oral Oral  Oral  SpO2: 100% 100% 94% 100%  Weight:        Isolation Precautions No active isolations  Medications Medications  0.9 %  sodium chloride infusion (has no administration in time range)  sodium chloride 0.9 % bolus 1,000 mL (0 mLs Intravenous Stopped 04/17/18 1343)  acetaminophen (TYLENOL) tablet 1,000 mg (1,000 mg Oral Given 04/17/18 1446)    Mobility walks with person assist

## 2018-04-17 NOTE — ED Triage Notes (Signed)
Patient BIB EMS from pt's PCP with complaints of symptomatic hypotension. Patient reports going to see his PCP today due to feeling dizzy. Patient has a new diagnosis of ulcerative colitis, with no other significant history. Patient BP for PCP was 71/68. Patient BP for EMS was 84/58. Patient was given 398m IV NS to 20g L AC by EMS. Patient BP in triage 93/66. Patient AxOx4. No LOC. Unremarkable 12-Lead EKG for EMS

## 2018-04-17 NOTE — H&P (Signed)
History and Physical    Zachary Horn GSU:[id number] DOB: 1961/04/14 DOA: 04/17/2018  PCP: Merrilee Seashore, MD Patient coming from: home  I have personally briefly reviewed patient's old medical records in [place]  Chief Complaint: SOB, fatigue  HPI: Zachary Horn is Zachary Horn 57 y.o. male with medical history significant of recently diagnosed ulcerative colitis presenting with SOB, LH, fainting spells over the past month.    She notes that over the past month he has had shortness of breath, lightheadedness with standing, fainting spells.  These have gotten worse over the past 3 to 4 days.  He notes worsening shortness of breath with exertion.  He is begun to walk with Zachary Horn cane because of his general fatigue.  He also uses an office chair on wheels to get around his kitchen.  He fainted 3-4 times over the past few days when getting out of bed at night.  He did bump his head once in his left shoulder.  He denies any pain at this time.  He denies any chest pain, fevers.  He does note chills.  He notes abdominal pain from the colitis which she notes is Zachary Horn little bit better.  Diarrhea is also Zachary Horn little bit better.  The stool is dark brown in color and he has not seen any blood recently.   ED Course: Labs, bolus, transfuse 2 units pRBC.  GI consult.  Hospitalists to admit.   Review of Systems: As per HPI otherwise 10 point review of systems negative.   Past Medical History:  Diagnosis Date  . Ulcerative colitis ([place])     History reviewed. No pertinent surgical history.   reports that he has never smoked. He has never used smokeless tobacco. He reports that he does not drink alcohol or use drugs.  No Known Allergies  History reviewed. No pertinent family history.  Prior to Admission medications   Medication Sig Start Date End Date Taking? Authorizing Provider  azaTHIOprine (IMURAN) 50 MG tablet Take 75 mg by mouth daily. 03/30/18  Yes [provider]  Fe Fum-FePoly-Vit C-Vit  B3 (INTEGRA) 62.5-62.5-40-3 MG CAPS Take 1 tablet by mouth 2 (two) times daily. 03/17/18  Yes [provider]  predniSONE (DELTASONE) 10 MG tablet Take 10 mg by mouth daily. 03/18/18  Yes [provider]  Vedolizumab (ENTYVIO IV) Inject into the vein See admin instructions. Next injection 04/27/2018, once monthly thereafter   Yes [provider]  LORazepam (ATIVAN) 1 MG tablet Take 1 tablet (1 mg total) by mouth at bedtime. Patient not taking: Reported on 04/17/2018 03/13/18   Varney Biles, MD    Physical Exam: Vitals:   04/17/18 1900 04/17/18 1959 04/17/18 2000 04/17/18 2011  BP: (!) 85/50 (!) 82/55 (!) 86/56   Pulse: 79 81 79   Resp: 16 18 20    Temp:    98.7 F (37.1 C)  TempSrc:    Oral  SpO2: 100% 94% 98%   Weight:      Height:        Constitutional: NAD, calm, comfortable, pale Vitals:   04/17/18 1900 04/17/18 1959 04/17/18 2000 04/17/18 2011  BP: (!) 85/50 (!) 82/55 (!) 86/56   Pulse: 79 81 79   Resp: 16 18 20    Temp:    98.7 F (37.1 C)  TempSrc:    Oral  SpO2: 100% 94% 98%   Weight:      Height:       Eyes: PERRL, lids and conjunctivae normal ENMT:  Mucous membranes are moist. Posterior pharynx clear of any exudate or lesions.Normal dentition.  Neck: normal, supple, no masses, no thyromegaly Respiratory: clear to auscultation bilaterally, no wheezing, no crackles. Normal respiratory effort. No accessory muscle use.  Cardiovascular: Regular rate and rhythm, no murmurs / rubs / gallops. No extremity edema. 2+ pedal pulses. No carotid bruits.  Abdomen: no tenderness, no masses palpated. No hepatosplenomegaly. Bowel sounds positive.  Musculoskeletal: no clubbing / cyanosis. No joint deformity upper and lower extremities. Good ROM, no contractures. Normal muscle tone.  Skin: no rashes, lesions, ulcers. No induration Neurologic: CN 2-12 grossly intact. Sensation intact, DTR normal. Strength 5/5 in all 4.  Psychiatric: Normal judgment and insight.  Alert and oriented x 3. Normal mood.   Labs on Admission: I have personally reviewed following labs and imaging studies  CBC: Recent Labs  Lab 04/17/18 1249 04/17/18 1815  WBC 5.6  --   HGB 6.2* 6.7*  HCT 19.2* 19.9*  MCV 85.3  --   PLT 361  --    Basic Metabolic Panel: Recent Labs  Lab 04/17/18 1249 04/17/18 1838  NA 124*  --   K 4.0  --   CL 89*  --   CO2 26  --   GLUCOSE 146*  --   BUN 13  --   CREATININE 0.90  --   CALCIUM 7.5*  --   MG  --  1.7   GFR: Estimated Creatinine Clearance: 69.8 mL/min (by C-G formula based on SCr of 0.9 mg/dL). Liver Function Tests: Recent Labs  Lab 04/17/18 1249  AST 15  ALT 12  ALKPHOS 106  BILITOT 0.6  PROT 5.5*  ALBUMIN 1.7*   No results for input(s): LIPASE, AMYLASE in the last 168 hours. No results for input(s): AMMONIA in the last 168 hours. Coagulation Profile: No results for input(s): INR, PROTIME in the last 168 hours. Cardiac Enzymes: No results for input(s): CKTOTAL, CKMB, CKMBINDEX, TROPONINI in the last 168 hours. BNP (last 3 results) No results for input(s): PROBNP in the last 8760 hours. HbA1C: No results for input(s): HGBA1C in the last 72 hours. CBG: No results for input(s): GLUCAP in the last 168 hours. Lipid Profile: No results for input(s): CHOL, HDL, LDLCALC, TRIG, CHOLHDL, LDLDIRECT in the last 72 hours. Thyroid Function Tests: No results for input(s): TSH, T4TOTAL, FREET4, T3FREE, THYROIDAB in the last 72 hours. Anemia Panel: No results for input(s): VITAMINB12, FOLATE, FERRITIN, TIBC, IRON, RETICCTPCT in the last 72 hours. Urine analysis:    Component Value Date/Time   COLORURINE YELLOW 04/17/2018 [place] 04/17/2018 1631   LABSPEC 1.003 (L) 04/17/2018 1631   PHURINE 6.0 04/17/2018 1631   GLUCOSEU NEGATIVE 04/17/2018 1631   HGBUR SMALL ([name]) 04/17/2018 1631   BILIRUBINUR NEGATIVE 04/17/2018 1631   BILIRUBINUR neg 04/03/2013 1104   KETONESUR NEGATIVE 04/17/2018 1631    PROTEINUR NEGATIVE 04/17/2018 1631   UROBILINOGEN 0.2 04/03/2013 1104   NITRITE NEGATIVE 04/17/2018 1631   LEUKOCYTESUR NEGATIVE 04/17/2018 1631    Radiological Exams on Admission: Dg Abd 1 View  Result Date: 04/17/2018 CLINICAL DATA:  Hypotension fever with ulcerative colitis EXAM: ABDOMEN - 1 VIEW COMPARISON:  None. FINDINGS: Air-filled but nondistended bowel in the central abdomen. Slightly featureless appearance of right abdominal loops. Phleboliths in the pelvis. IMPRESSION: Nonobstructed gas pattern. Slightly featureless appearance of bowel within the central and right abdomen, possible bowel wall thickening. Electronically Signed   By: Donavan Foil M.D.   On: 04/17/2018 18:28  Dg Chest Port 1 View  Result Date: 04/17/2018 CLINICAL DATA:  Shortness of breath.  Lightheaded. EXAM: PORTABLE CHEST 1 VIEW COMPARISON:  03/30/2018. FINDINGS: Normal sized heart. Clear lungs with normal vascularity. Diffuse osteopenia and lower thoracic spine degenerative changes. IMPRESSION: No acute abnormality. Electronically Signed   By: Claudie Revering M.D.   On: 04/17/2018 16:26    EKG: Independently reviewed. Sinus rhthym   Assessment/Plan Active Problems:   GI bleed   Shock circulatory (HCC)  Pt developed fever and hypotension after admit orders placed.  Orders changed to stepdown.  Discussed with nursing, increase rate on blood, start LR bolus, will have levophed as well.  With fever, culture and start broad spectrum abx. Persistently hypotensive on arrival to stepdown, PCCM consulted (recommending transitioning to neo from levophed, additional bolus, and LR).  GI Bleed  Ulcerative Colitis:  Anemia and hypotension on presentation, improved initially with IVF.  Hemoccult positive.  Transfuse 2 units pRBC's.  GI consulted.  On azathioprine, prednisone, and entyvio recently started Monday.  GI recommending azathioprine, solumedrol 80 mg IV BID.  Follow up KUB (nonobstructive bowel gas pattern).  May  consider anti-TNF agents or surgery c/s if he deteriorates. Q8 H/H Transfuse for <7   Fever  Hypotension  Sepsis:  CXR without infiltrate.  Follow urine and blood cx.  Broad spectrum abx with vanc/zosyn.  Component of hypotension likely 2/2 anemia, but with fever concerning for sepsis.   Syncope: likely orthostatic in setting of anemia and hypotension.  Noted head trauma and L shoulder pain, but no injury noted on exam and normal neuro exam, continue to monitor, I don't think any imaging necessary now.  EKG with NSR.  Follow on tele.  CTM after transfusion.  Hyponatremia: likely hypovolemic, follow after transfusions and IVF   DVT prophylaxis: SCD Code Status: Full  Family Communication: none at bedside  Disposition Plan: pending improvement  Consults called: GI, PCCM  Admission status: stepdown   60 min critical care time.  Pt critically ill with ABLA, sepsis, hypotension.   Fayrene Helper MD [facility] Pager [phone]  If 7PM-7AM, please contact night-coverage [email or web address] Password [email or web address]  04/17/2018, 9:01 PM

## 2018-04-17 NOTE — ED Notes (Signed)
MD Florene Glen called to patient bedside r/t patient hypotension.

## 2018-04-17 NOTE — ED Provider Notes (Signed)
[facility] DEPT Provider Note   CSN: [id number] Arrival date & time: 04/17/18  1237     History   Chief Complaint No chief complaint on file.   HPI Zachary Horn is a 57 y.o. male.  Patient c/o feeling faint/lightheaded when stands and generalized weakness for past couple weeks. Symptoms severe, persistent, worse today, worse w standing. Was at pcp today for same, and was noted to be hypotensive. Ems notes initial bp 70/, improved to 90/s with ns bolus. Patient notes dx with ulcerative colitis 1 month ago. Has been having 8-10 diarrhea stools per day - watery. No bloody bms or melena. No fever or chills. No abd pain. No vomiting. No chest pain or discomfort. +doe. No leg pain or swelling.   The history is provided by the patient and the EMS personnel.    No past medical history on file.  Patient Active Problem List   Diagnosis Date Noted  . Pain of right heel 07/19/2016    No past surgical history on file.      Home Medications    Prior to Admission medications   Medication Sig Start Date End Date Taking? Authorizing Provider  ibuprofen (ADVIL,MOTRIN) 200 MG tablet Take 200 mg by mouth every 6 (six) hours as needed for moderate pain.     [provider]  LORazepam (ATIVAN) 1 MG tablet Take 1 tablet (1 mg total) by mouth at bedtime. 03/13/18   Varney Biles, MD    Family History No family history on file.  Social History Social History   Tobacco Use  . Smoking status: Never Smoker  . Smokeless tobacco: Never Used  Substance Use Topics  . Alcohol use: No  . Drug use: No     Allergies   Patient has no known allergies.   Review of Systems Review of Systems  Constitutional: Negative for fever.  HENT: Negative for sore throat.   Eyes: Negative for redness.  Respiratory: Negative for shortness of breath.   Cardiovascular: Negative for chest pain and palpitations.  Gastrointestinal: Negative for abdominal pain and  vomiting.  Genitourinary: Negative for flank pain.  Musculoskeletal: Negative for back pain and neck pain.  Skin: Negative for rash.  Neurological: Positive for weakness. Negative for headaches.  Hematological: Does not bruise/bleed easily.  Psychiatric/Behavioral: Negative for confusion.     Physical Exam Updated Vital Signs BP 131/70   Pulse 89   Temp 97.7 F (36.5 C) (Oral)   Resp (!) 23   Wt 65.8 kg (145 lb)   SpO2 100%   BMI 21.41 kg/m   Physical Exam  Constitutional: He is oriented to person, place, and time. He appears well-developed and well-nourished.  HENT:  Mouth/Throat: Oropharynx is clear and moist.  Eyes: Pupils are equal, round, and reactive to light.  Neck: Neck supple. No tracheal deviation present.  Cardiovascular: Normal rate, regular rhythm, normal heart sounds and intact distal pulses. Exam reveals no gallop and no friction rub.  No murmur heard. Pulmonary/Chest: Effort normal and breath sounds normal. No accessory muscle usage. No respiratory distress.  Abdominal: Soft. Bowel sounds are normal. He exhibits no distension. There is no tenderness.  Genitourinary:  Genitourinary Comments: No cva tenderness  Musculoskeletal: He exhibits no edema.  Neurological: He is alert and oriented to person, place, and time.  Motor/sens grossly intact bil.   Skin: Skin is warm and dry. No rash noted.  Psychiatric: He has a normal mood and affect.  Nursing note and vitals  reviewed.    ED Treatments / Results  Labs (all labs ordered are listed, but only abnormal results are displayed) Results for orders placed or performed during the hospital encounter of 04/17/18  CBC  Result Value Ref Range   WBC 5.6 4.0 - 10.5 K/uL   RBC 2.25 (L) 4.22 - 5.81 MIL/uL   Hemoglobin 6.2 (LL) 13.0 - 17.0 g/dL   HCT 19.2 (L) 39.0 - 52.0 %   MCV 85.3 78.0 - 100.0 fL   MCH 27.6 26.0 - 34.0 pg   MCHC 32.3 30.0 - 36.0 g/dL   RDW 13.4 11.5 - 15.5 %   Platelets 361 150 - 400 K/uL    Comprehensive metabolic panel  Result Value Ref Range   Sodium 124 (L) 135 - 145 mmol/L   Potassium 4.0 3.5 - 5.1 mmol/L   Chloride 89 (L) 98 - 111 mmol/L   CO2 26 22 - 32 mmol/L   Glucose, Bld 146 (H) 70 - 99 mg/dL   BUN 13 6 - 20 mg/dL   Creatinine, Ser 0.90 0.61 - 1.24 mg/dL   Calcium 7.5 (L) 8.9 - 10.3 mg/dL   Total Protein 5.5 (L) 6.5 - 8.1 g/dL   Albumin 1.7 (L) 3.5 - 5.0 g/dL   AST 15 15 - 41 U/L   ALT 12 0 - 44 U/L   Alkaline Phosphatase 106 38 - 126 U/L   Total Bilirubin 0.6 0.3 - 1.2 mg/dL   GFR calc non Af Amer >60 >60 mL/min   GFR calc Af Amer >60 >60 mL/min   Anion gap 9 5 - 15  POC occult blood, ED Provider will collect  Result Value Ref Range   Fecal Occult Bld POSITIVE (A) NEGATIVE  Type and screen  Result Value Ref Range   ABO/RH(D) O POS    Antibody Screen PENDING    Sample Expiration      04/20/2018 Performed at [facility], [place] [address], [place], [place] [address]   Prepare RBC  Result Value Ref Range   Order Confirmation      ORDER PROCESSED BY BLOOD BANK Performed at [facility] [address], [place], [place] [address]    Dg Chest 2 View  Result Date: 03/30/2018 CLINICAL DATA:  Pt to start new medication for Ulcerative pancolitis with rectal bleeding, recent TB test inconclusive per pt, some s.o.b today EXAM: CHEST - 2 VIEW COMPARISON:  None. FINDINGS: Cardiac silhouette is normal in size and configuration. Normal mediastinal and hilar contours. Clear lungs.  No pleural effusion or pneumothorax. Skeletal structures are unremarkable. IMPRESSION: No active cardiopulmonary disease. Electronically Signed   By: Lajean Manes M.D.   On: 03/30/2018 14:05    EKG EKG Interpretation  Date/Time:  Friday April 17 2018 13:21:10 EDT Ventricular Rate:  86 PR Interval:    QRS Duration: 94 QT Interval:  370 QTC Calculation: 443 R Axis:   69 Text Interpretation:  Sinus rhythm No previous tracing Confirmed by  Lajean Saver [phone]) on 04/17/2018 1:49:21 PM   Radiology No results found.  Procedures Procedures (including critical care time)  Medications Ordered in ED Medications  sodium chloride 0.9 % bolus 1,000 mL (has no administration in time range)     Initial Impression / Assessment and Plan / ED Course  I have reviewed the triage vital signs and the nursing notes.  Pertinent labs & imaging results that were available during my care of the patient were reviewed by me and considered  in my medical decision making (see chart for details).  Iv ns bolus. Labs. Monitor. Ecg.   Reviewed nursing notes and prior charts for additional history.    labs reviewed -- hgb very low, 6.  2nd iv line.   Hemoccult - liquid, reddish stool, heme pos.  Emergently transfuse prbc, 2 units.  Gi consulted. Discussed with Dr Cristina Gong - he will see in consult - medical service admission.  Hospitalists consulted for admission.  CRITICAL CARE  RE: acute, severe anemia w hypotension, emergent transfusion. Ulcerative colitis/severe.  Performed by: Mirna Mires Total critical care time: 40 minutes Critical care time was exclusive of separately billable procedures and treating other patients. Critical care was necessary to treat or prevent imminent or life-threatening deterioration. Critical care was time spent personally by me on the following activities: development of treatment plan with patient and/or surrogate as well as nursing, discussions with consultants, evaluation of patient's response to treatment, examination of patient, obtaining history from patient or surrogate, ordering and performing treatments and interventions, ordering and review of laboratory studies, ordering and review of radiographic studies, pulse oximetry and re-evaluation of patient's condition.   Final Clinical Impressions(s) / ED Diagnoses   Final diagnoses:  None    ED Discharge Orders    None       Lajean Saver,  MD 04/17/18 1349

## 2018-04-17 NOTE — ED Notes (Signed)
ED TO INPATIENT HANDOFF REPORT  Name/Age/Gender Zachary Horn 57 y.o. male  Code Status   Home/SNF/Other Home  Chief Complaint Hyponatremia [E87.1] Severe protein-calorie malnutrition ([place]) [E43] Weight loss [R63.4] Fever [R50.9] Failure to thrive in adult [R62.7] Hypotension due to blood loss [I95.89] Symptomatic anemia [D64.9] Ulcerative pancolitis with rectal bleeding (HCC) [K51.011] GI bleed [K92.2]  Level of Care/Admitting Diagnosis ED Disposition    ED Disposition Condition [place]: [facility] [100102]  Level of Care: Stepdown [14]  Admit to SDU based on following criteria: Hemodynamic compromise or significant risk of instability:  Patient requiring short term acute titration and management of vasoactive drips, and invasive monitoring (i.e., CVP and Arterial line).  Diagnosis: GI bleed [256389]  Admitting Physician: Elodia Florence [[id number]]  Attending Physician: Cephus Slater, A CALDWELL [phone]  Estimated length of stay: past midnight tomorrow  Certification:: I certify this patient will need inpatient services for at least 2 midnights  PT Class (Do Not Modify): Inpatient [101]  PT Acc Code (Do Not Modify): Private [1]       Medical History Past Medical History:  Diagnosis Date  . Ulcerative colitis ([place])     Allergies No Known Allergies  IV Location/Drains/Wounds Patient Lines/Drains/Airways Status   Active Line/Drains/Airways    Name:   Placement date:   Placement time:   Site:   Days:   Peripheral IV 04/17/18 Left Antecubital   04/17/18    -    Antecubital   less than 1   Peripheral IV 04/17/18 Right Antecubital   04/17/18    1502    Antecubital   less than 1   Peripheral IV 04/17/18 Right Forearm   04/17/18    1632    Forearm   less than 1          Labs/Imaging Results for orders placed or performed during the hospital encounter of 04/17/18 (from the past 48 hour(s))  CBC     Status: Abnormal    Collection Time: 04/17/18 12:49 PM  Result Value Ref Range   WBC 5.6 4.0 - 10.5 K/uL   RBC 2.25 (L) 4.22 - 5.81 MIL/uL   Hemoglobin 6.2 (LL) 13.0 - 17.0 g/dL    Comment: CRITICAL RESULT CALLED TO, READ BACK BY AND VERIFIED WITH: TALKINGTON,J. RN @1320  ON 8.2.19 BY NMCCOY    HCT 19.2 (L) 39.0 - 52.0 %   MCV 85.3 78.0 - 100.0 fL   MCH 27.6 26.0 - 34.0 pg   MCHC 32.3 30.0 - 36.0 g/dL   RDW 13.4 11.5 - 15.5 %   Platelets 361 150 - 400 K/uL    Comment: Performed at [facility], [place] [address], [place], [place] [address]  Comprehensive metabolic panel     Status: Abnormal   Collection Time: 04/17/18 12:49 PM  Result Value Ref Range   Sodium 124 (L) 135 - 145 mmol/L   Potassium 4.0 3.5 - 5.1 mmol/L   Chloride 89 (L) 98 - 111 mmol/L   CO2 26 22 - 32 mmol/L   Glucose, Bld 146 (H) 70 - 99 mg/dL   BUN 13 6 - 20 mg/dL   Creatinine, Ser 0.90 0.61 - 1.24 mg/dL   Calcium 7.5 (L) 8.9 - 10.3 mg/dL   Total Protein 5.5 (L) 6.5 - 8.1 g/dL   Albumin 1.7 (L) 3.5 - 5.0 g/dL   AST 15 15 - 41 U/L   ALT 12 0 - 44 U/L  Alkaline Phosphatase 106 38 - 126 U/L   Total Bilirubin 0.6 0.3 - 1.2 mg/dL   GFR calc non Af Amer >60 >60 mL/min   GFR calc Af Amer >60 >60 mL/min    Comment: (NOTE) The eGFR has been calculated using the CKD EPI equation. This calculation has not been validated in all clinical situations. eGFR's persistently <60 mL/min signify possible Chronic Kidney Disease.    Anion gap 9 5 - 15    Comment: Performed at [facility], [place] [address] [place], [place] [address]  Type and screen     Status: None (Preliminary result)   Collection Time: 04/17/18  1:05 PM  Result Value Ref Range   ABO/RH(D) O POS    Antibody Screen NEG    Sample Expiration 04/20/2018    Unit Number [id number]    Blood Component Type RED CELLS,LR    Unit division 00    Status of Unit ISSUED    Transfusion Status OK TO TRANSFUSE    Crossmatch Result       Compatible Performed at [facility] [address], [place], [place] [address]    Unit Number [id number]    Blood Component Type RED CELLS,LR    Unit division 00    Status of Unit ALLOCATED    Transfusion Status OK TO TRANSFUSE    Crossmatch Result Compatible   ABO/Rh     Status: None   Collection Time: 04/17/18  1:05 PM  Result Value Ref Range   ABO/RH(D)      O POS Performed at [facility], [place] [address] [place], [place] [address]   POC occult blood, ED Provider will collect     Status: Abnormal   Collection Time: 04/17/18  1:27 PM  Result Value Ref Range   Fecal Occult Bld POSITIVE (A) NEGATIVE  Prepare RBC     Status: None   Collection Time: 04/17/18  1:27 PM  Result Value Ref Range   Order Confirmation      ORDER PROCESSED BY BLOOD BANK Performed at [facility] [address] [place], [place] [address]   Urinalysis, Routine w reflex microscopic     Status: Abnormal   Collection Time: 04/17/18  4:31 PM  Result Value Ref Range   Color, Urine YELLOW YELLOW   APPearance CLEAR CLEAR   Specific Gravity, Urine 1.003 (L) 1.005 - 1.030   pH 6.0 5.0 - 8.0   Glucose, UA NEGATIVE NEGATIVE mg/dL   Hgb urine dipstick SMALL (A) NEGATIVE   Bilirubin Urine NEGATIVE NEGATIVE   Ketones, ur NEGATIVE NEGATIVE mg/dL   Protein, ur NEGATIVE NEGATIVE mg/dL   Nitrite NEGATIVE NEGATIVE   Leukocytes, UA NEGATIVE NEGATIVE   RBC / HPF 0-5 0 - 5 RBC/hpf   WBC, UA 0-5 0 - 5 WBC/hpf   Bacteria, UA RARE (A) NONE SEEN    Comment: Performed at [facility], [place] [address], [place], [place] [address]   Dg Chest Port 1 View  Result Date: 04/17/2018 CLINICAL DATA:  Shortness of breath.  Lightheaded. EXAM: PORTABLE CHEST 1 VIEW COMPARISON:  03/30/2018. FINDINGS: Normal sized heart. Clear lungs with normal vascularity. Diffuse osteopenia and lower thoracic spine degenerative changes. IMPRESSION: No acute abnormality.  Electronically Signed   By: Claudie Revering M.D.   On: 04/17/2018 16:26    Pending Labs Unresulted Labs (From admission, onward)   Start     Ordered   04/18/18 0500  Creatinine, serum  Daily,   R     04/17/18 1620   04/17/18 1625  Prepare RBC  (Adult Blood Administration - Red Blood Cells)  Once,   R    Question Answer Comment  # of Units 1 unit   Transfusion Indications Symptomatic Anemia   Number of Units to Keep Ahead 2 units ahead   If emergent release call blood bank Not emergent release      04/17/18 1624   04/17/18 1604  Culture, Urine  Once,   R     04/17/18 1603   04/17/18 1559  Lactic acid, plasma  STAT Now then every 3 hours,   R     04/17/18 1558   04/17/18 1559  Culture, blood (routine x 2)  BLOOD CULTURE X 2,   R     04/17/18 1558   Signed and Held  HIV antibody (Routine Testing)  Once,   R     Signed and Held   Signed and Held  CBC  Tomorrow morning,   R     Signed and Held   Signed and Held  Basic metabolic panel  Tomorrow morning,   R     Signed and Held   Signed and Held  Magnesium  Tomorrow morning,   R     Signed and Held   Signed and Held  Hemoglobin and hematocrit, blood  Now then every 8 hours,   R     Signed and Held      Vitals/Pain Today's Vitals   04/17/18 1600 04/17/18 1615 04/17/18 1630 04/17/18 1645  BP: (!) 72/54 (!) 79/57 (!) 85/58 (!) 83/57  Pulse: 97 99 86 80  Resp: (!) 23 (!) 29 (!) 21 (!) 24  Temp:   98.7 F (37.1 C)   TempSrc:   Oral   SpO2: 100% 100% 100% 100%  Weight:        Isolation Precautions No active isolations  Medications Medications  0.9 %  sodium chloride infusion (has no administration in time range)  lactated ringers bolus 1,000 mL (1,000 mLs Intravenous New Bag/Given 04/17/18 1632)  norepinephrine (LEVOPHED) 4 mg in dextrose 5 % 250 mL (0.016 mg/mL) infusion (has no administration in time range)  vancomycin (VANCOCIN) 1,250 mg in sodium chloride 0.9 % 250 mL IVPB (has no administration in time range)   piperacillin-tazobactam (ZOSYN) IVPB 3.375 g (3.375 g Intravenous New Bag/Given 04/17/18 1645)  piperacillin-tazobactam (ZOSYN) IVPB 3.375 g (has no administration in time range)  vancomycin (VANCOCIN) IVPB 750 mg/150 ml premix (has no administration in time range)  0.9 %  sodium chloride infusion (Manually program via Guardrails IV Fluids) (has no administration in time range)  sodium chloride 0.9 % bolus 1,000 mL (0 mLs Intravenous Stopped 04/17/18 1343)  acetaminophen (TYLENOL) tablet 1,000 mg (1,000 mg Oral Given 04/17/18 1446)    Mobility walks with person assist and device

## 2018-04-17 NOTE — ED Notes (Addendum)
CRITICAL VALUE STICKER  CRITICAL VALUE: hgb 6.2  RECEIVER (on-site recipient of call): Jake RN  DATE & TIME NOTIFIED: 04/17/18 121p  MESSENGER (representative from lab): Elmyra Ricks  MD NOTIFIED: Ashok Cordia  TIME OF NOTIFICATION: 04/17/18 121p  RESPONSE: see orders

## 2018-04-17 NOTE — Assessment & Plan Note (Addendum)
ONgoing circulatory shhock despite no active bleeding and improvement in PCT  And fever. Has associated sinus bradycardia - raising picture of EP related issues but is more likely bradycardia physiologic   PLAN 1L more fluid bolus and then 125cch/ LR Change Solumedrol to hydrocort  Abx as below Agree with neo change to levophed - MAP goal > 65 STat CBC check, trop check, lactate ched   PRBC for hgb </= 6.9gm%    - exceptions are   -  if ACS susepcted/confirmed then transfuse for hgb </= 8.0gm%,  or    -  active bleeding with hemodynamic instability, then transfuse regardless of hemoglobin value   At at all times try to transfuse 1 unit prbc as possible with exception of active hemorrhage Place picc line   Anti-infectives (From admission, onward)   Start     Dose/Rate Route Frequency Ordered Stop   04/18/18 0500  vancomycin (VANCOCIN) IVPB 750 mg/150 ml premix     750 mg 150 mL/hr over 60 Minutes Intravenous Every 12 hours 04/17/18 1621     04/17/18 2200  piperacillin-tazobactam (ZOSYN) IVPB 3.375 g     3.375 g 12.5 mL/hr over 240 Minutes Intravenous Every 8 hours 04/17/18 1620     04/17/18 1630  vancomycin (VANCOCIN) 1,250 mg in sodium chloride 0.9 % 250 mL IVPB     1,250 mg 166.7 mL/hr over 90 Minutes Intravenous  Once 04/17/18 1620     04/17/18 1630  piperacillin-tazobactam (ZOSYN) IVPB 3.375 g     3.375 g 100 mL/hr over 30 Minutes Intravenous  Once 04/17/18 1620 04/17/18 1714

## 2018-04-17 NOTE — ED Notes (Signed)
Bed: WA04 Expected date:  Expected time:  Means of arrival:  Comments: 57 yo hypotension from doctors office

## 2018-04-17 NOTE — Consult Note (Signed)
PULMONARY / CRITICAL CARE MEDICINE   Name: Zachary Horn MRN: [id number] DOB: [birth year] PCP Merrilee Seashore, MD LOS 0 as of 04/17/2018     ADMISSION DATE:  04/17/2018 CONSULTATION DATE:  04/17/2018   REFERRING MD:  [facility] Dr Marcelline Deist  CHIEF COMPLAINT:  Circulatory shock  HISTORY OF PRESENT ILLNESS:   - SERAFINO BURCIAGA , 57 y.o. , with dob December 08, 1960 and male ,Not Hispanic or Latino from [place] [address] - presents to [facility] ICU -> REcent dx of Ulcerative colitis. On prednisone, immuran and entviyuo. Presnted to ER .04/17/2018 with passing out spells and mild bleeding with trips to bathroom x 1 month and found to be febrile 101 and hgb 6. Rx wih 1 unit PRBC an dfluis and still hypotensive but otherwise feeling well. CCM called for consultation for circultory shock    PAST MEDICAL HISTORY :  He  has a past medical history of Ulcerative colitis ([place]).  PAST SURGICAL HISTORY: He  has no past surgical history on file.  No Known Allergies  No current facility-administered medications on file prior to encounter.    Current Outpatient Medications on File Prior to Encounter  Medication Sig  . azaTHIOprine (IMURAN) 50 MG tablet Take 75 mg by mouth daily.  . Fe Fum-FePoly-Vit C-Vit B3 (INTEGRA) 62.5-62.5-40-3 MG CAPS Take 1 tablet by mouth 2 (two) times daily.  . predniSONE (DELTASONE) 10 MG tablet Take 10 mg by mouth daily.  . Vedolizumab (ENTYVIO IV) Inject into the vein See admin instructions. Next injection 04/27/2018, once monthly thereafter  . LORazepam (ATIVAN) 1 MG tablet Take 1 tablet (1 mg total) by mouth at bedtime. (Patient not taking: Reported on 04/17/2018)    FAMILY HISTORY:  His indicated that his mother is deceased. He indicated that his father is deceased. He indicated that his brother is alive.   SOCIAL HISTORY: He  reports that he has never smoked. He has never used smokeless tobacco. He reports that he does not drink alcohol or use drugs.   VITAL  SIGNS: BP (!) 83/57   Pulse 80   Temp 98.7 F (37.1 C) (Oral)   Resp (!) 24   Wt 65.8 kg (145 lb)   SpO2 100%   BMI 21.41 kg/m   HEMODYNAMICS:    VENTILATOR SETTINGS:    INTAKE / OUTPUT: No intake/output data recorded.     EXAM  General Appearance:    Looks well surprisingly but palor noted  Head:    Normocephalic, without obvious abnormality, atraumatic  Eyes:    PERRL - yes, conjunctiva/corneas - clear      Ears:    Normal external ear canals, both ears  Nose:   NG tube - no  Throat:  ETT TUBE - no , OG tube - no  Neck:   Supple,  No enlargement/tenderness/nodules     Lungs:     Clear to auscultation bilaterally,   Chest wall:    No deformity  Heart:    S1 and S2 normal, no murmur, CVP - no.  Pressors - no  Abdomen:     Soft, no masses, no organomegaly. Non tnder  Genitalia:    Not done  Rectal:   not done  Extremities:   Extremities- intact     Skin:   Intact in exposed areas .     Neurologic:   Sedation - none -> RASS - 0 . Moves all 4s - yes. CAM-ICU - neg . Orientation - x3+  LABS  PULMONARY No results for input(s): PHART, PCO2ART, PO2ART, HCO3, TCO2, O2SAT in the last 168 hours.  Invalid input(s): PCO2, PO2  CBC Recent Labs  Lab 04/17/18 1249  HGB 6.2*  HCT 19.2*  WBC 5.6  PLT 361    COAGULATION No results for input(s): INR in the last 168 hours.  CARDIAC  No results for input(s): TROPONINI in the last 168 hours. No results for input(s): PROBNP in the last 168 hours.   CHEMISTRY Recent Labs  Lab 04/17/18 1249  NA 124*  K 4.0  CL 89*  CO2 26  GLUCOSE 146*  BUN 13  CREATININE 0.90  CALCIUM 7.5*   Estimated Creatinine Clearance: 82.3 mL/min (by C-G formula based on SCr of 0.9 mg/dL).   LIVER Recent Labs  Lab 04/17/18 1249  AST 15  ALT 12  ALKPHOS 106  BILITOT 0.6  PROT 5.5*  ALBUMIN 1.7*     INFECTIOUS No results for input(s): LATICACIDVEN, PROCALCITON in the last 168 hours.   ENDOCRINE CBG (last 3)   No results for input(s): GLUCAP in the last 72 hours.       IMAGING x48h  - image(s) personally visualized  -   highlighted in bold Dg Chest Port 1 View  Result Date: 04/17/2018 CLINICAL DATA:  Shortness of breath.  Lightheaded. EXAM: PORTABLE CHEST 1 VIEW COMPARISON:  03/30/2018. FINDINGS: Normal sized heart. Clear lungs with normal vascularity. Diffuse osteopenia and lower thoracic spine degenerative changes. IMPRESSION: No acute abnormality. Electronically Signed   By: Claudie Revering M.D.   On: 04/17/2018 16:26       ASSESSMENT and PLAN  Shock circulatory (HCC) Hypotension likely due to volume loss from blood loss but ? Why did this happen despite colitis Rx  - anemia of slow bleeding - sp 1 unit PRBC in ER Also sepsis syndrome  - He is immunosuppressed and has fever Could be relative adrenal insuffiency superimposed as well  PLAN 2L fluid bolus and then 125cch/ LR 1 more unit PRBC Solumderol per GI  abx as below Track lactate, pct, mag  Neo per PIV  Anti-infectives (From admission, onward)   Start     Dose/Rate Route Frequency Ordered Stop   04/18/18 0500  vancomycin (VANCOCIN) IVPB 750 mg/150 ml premix     750 mg 150 mL/hr over 60 Minutes Intravenous Every 12 hours 04/17/18 1621     04/17/18 2200  piperacillin-tazobactam (ZOSYN) IVPB 3.375 g     3.375 g 12.5 mL/hr over 240 Minutes Intravenous Every 8 hours 04/17/18 1620     04/17/18 1630  vancomycin (VANCOCIN) 1,250 mg in sodium chloride 0.9 % 250 mL IVPB     1,250 mg 166.7 mL/hr over 90 Minutes Intravenous  Once 04/17/18 1620     04/17/18 1630  piperacillin-tazobactam (ZOSYN) IVPB 3.375 g     3.375 g 100 mL/hr over 30 Minutes Intravenous  Once 04/17/18 1620 04/17/18 1714           FAMILY  - Updates: 04/17/2018 --> patietn at bedside  - Inter-disciplinary family meet or Palliative Care meeting due by:  DAy 7. Current LOS is LOS 0 days  CODE STATUS    Code Status Orders  (From admission, onward)         Start     Ordered   04/17/18 1747  Full code  Continuous     04/17/18 1746    Code Status History    This patient has a current code status but no  historical code status.        DISPO Keep in ICU       The patient is critically ill with multiple organ systems failure and requires high complexity decision making for assessment and support, frequent evaluation and titration of therapies, application of advanced monitoring technologies and extensive interpretation of multiple databases.   Critical Care Time devoted to patient care services described in this note is  30  Minutes. This time reflects time of care of this signee Dr Brand Males. This critical care time does not reflect procedure time, or teaching time or supervisory time of PA/NP/Med student/Med Resident etc but could involve care discussion time   Dr. Brand Males, M.D., [facility].C.P Pulmonary and Critical Care Medicine Staff Physician, [place] Director - Interstitial Lung Disease  Program  Pulmonary [place] at [place], [place], [address]  Pager: [phone], If no answer or between  15:00h - 7:00h: call [phone] Telephone: [phone]

## 2018-04-17 NOTE — Progress Notes (Signed)
Pharmacy Antibiotic Note  Zachary Horn is a 57 y.o. male recently diagnosed with ulcerative colitis presented to the ED from his PCP office on 04/17/2018 for workup pg hypotension and dizziness.  To start vancomycin and zosyn for suspected sepsis/intra-abdominal infection.  - Tmax 101, wbc wnl, scr 0.90   Plan: - vancomycin 1250 mg IV x1 now, then 750 mg IV q12h for est AUC 480 - zosyn 3.375 gm IV x1 over 30 min, then 3.375 gm IV q8h (infuse over 4 hrs. - daily scr ______________________________  Weight: 145 lb (65.8 kg)  Temp (24hrs), Avg:99.4 F (37.4 C), Min:97.7 F (36.5 C), Max:101 F (38.3 C)  Recent Labs  Lab 04/17/18 1249  WBC 5.6  CREATININE 0.90    Estimated Creatinine Clearance: 82.3 mL/min (by C-G formula based on SCr of 0.9 mg/dL).    No Known Allergies   Thank you for allowing pharmacy to be a part of this patient's care.  Lynelle Doctor 04/17/2018 4:11 PM

## 2018-04-17 NOTE — Consult Note (Addendum)
Referring Provider:   Dr. Fayrene Helper Primary Care Physician:  Merrilee Seashore, MD Primary Gastroenterologist:  Dr. Alessandra Bevels  Reason for Consultation: Ulcerative colitis  HPI: Zachary Horn is a 57 y.o. male being admitted through the emergency room because of intensified ulcerative colitis symptoms.  Patient has newly diagnosed ulcerative pancolitis, based on onset of compatible symptoms about 6 weeks ago, with colonoscopy by Dr. Alessandra Bevels showing moderate to severe active colitis throughout the colon, and compatible biopsies.  Incidentally, concurrent endoscopy at the time of his colonoscopy showed evidence of Candida esophagitis, for which reason Dr. Alessandra Bevels chose to delay initiation of immunosuppressive therapy, such as azathioprine, for a couple of weeks.  Therapy for this was initially steroids, with azathioprine 75 mg daily having been added about 2 weeks ago, and the patient's first dose of Entyvio having been administered 4 days ago.  With this, there has been some decrease in the frequency of diarrhea, from a baseline of 15 bowel movements a day to a current level of perhaps 6 bowel movements per day.  However, he presented to the emergency room today because of recurrent passing out spells while using the bathroom, and it was noted that there was a further drop in hemoglobin to a level of 6 (recent baseline was 11) and the patient, since coming to the emergency room, has had a low-grade fever to 101 degrees, and hypotension with systolics in the 40X, responding somewhat to IV fluids.  He has also just received 1 unit of packed cells.  The patient denies abdominal pain, except for cramps right before bowel movements, which are relieved by defecation.  No problem with abdominal distention.  No extraintestinal manifestations of IBD such as joint effusions, ocular pain, or unusual skin rashes.  No chest pain, but he has had some degree of shortness of breath.  Since the onset  of symptoms about 6 weeks ago, the patient has lost about 35 pounds and has been unable to work as a Arboriculturist.     Past Medical History:  Diagnosis Date  . Ulcerative colitis ([place])     History reviewed. No pertinent surgical history.  Prior to Admission medications   Medication Sig Start Date End Date Taking? Authorizing Provider  azaTHIOprine (IMURAN) 50 MG tablet Take 75 mg by mouth daily. 03/30/18  Yes [provider]  Fe Fum-FePoly-Vit C-Vit B3 (INTEGRA) 62.5-62.5-40-3 MG CAPS Take 1 tablet by mouth 2 (two) times daily. 03/17/18  Yes [provider]  predniSONE (DELTASONE) 10 MG tablet Take 10 mg by mouth daily. 03/18/18  Yes [provider]  Vedolizumab (ENTYVIO IV) Inject into the vein See admin instructions. Next injection 04/27/2018, once monthly thereafter   Yes [provider]  LORazepam (ATIVAN) 1 MG tablet Take 1 tablet (1 mg total) by mouth at bedtime. Patient not taking: Reported on 04/17/2018 03/13/18   Varney Biles, MD    Current Facility-Administered Medications  Medication Dose Route Frequency Provider Last Rate Last Dose  . 0.9 %  sodium chloride infusion (Manually program via Guardrails IV Fluids)   Intravenous Once Elodia Florence., MD      . 0.9 %  sodium chloride infusion  10 mL/hr Intravenous Once Lajean Saver, MD      . lactated ringers bolus 1,000 mL  1,000 mL Intravenous Once Elodia Florence., MD 983.6 mL/hr at 04/17/18 1632 1,000 mL at 04/17/18 1632  . norepinephrine (LEVOPHED) 4 mg in dextrose 5 % 250 mL (0.016 mg/mL) infusion  0-40 mcg/min Intravenous Titrated Minda Ditto, RPH      . piperacillin-tazobactam (ZOSYN) IVPB 3.375 g  3.375 g Intravenous Once Lynelle Doctor, RPH 100 mL/hr at 04/17/18 1645 3.375 g at 04/17/18 1645  . piperacillin-tazobactam (ZOSYN) IVPB 3.375 g  3.375 g Intravenous Q8H Pham, Anh P, RPH      . vancomycin (VANCOCIN) 1,250 mg in sodium chloride 0.9 % 250 mL IVPB  1,250 mg  Intravenous Once Lynelle Doctor, RPH      . [START ON 04/18/2018] vancomycin (VANCOCIN) IVPB 750 mg/150 ml premix  750 mg Intravenous Q12H Pham, Anh P, RPH       Current Outpatient Medications  Medication Sig Dispense Refill  . azaTHIOprine (IMURAN) 50 MG tablet Take 75 mg by mouth daily.  0  . Fe Fum-FePoly-Vit C-Vit B3 (INTEGRA) 62.5-62.5-40-3 MG CAPS Take 1 tablet by mouth 2 (two) times daily.  3  . predniSONE (DELTASONE) 10 MG tablet Take 10 mg by mouth daily.  0  . Vedolizumab (ENTYVIO IV) Inject into the vein See admin instructions. Next injection 04/27/2018, once monthly thereafter    . LORazepam (ATIVAN) 1 MG tablet Take 1 tablet (1 mg total) by mouth at bedtime. (Patient not taking: Reported on 04/17/2018) 6 tablet 0    Allergies as of 04/17/2018  . (No Known Allergies)    History reviewed. No pertinent family history.  Social History   Socioeconomic History  . Marital status: Single    Spouse name: Not on file  . Number of children: Not on file  . Years of education: Not on file  . Highest education level: Not on file  Occupational History  . Not on file  Social Needs  . Financial resource strain: Not on file  . Food insecurity:    Worry: Not on file    Inability: Not on file  . Transportation needs:    Medical: Not on file    Non-medical: Not on file  Tobacco Use  . Smoking status: Never Smoker  . Smokeless tobacco: Never Used  Substance and Sexual Activity  . Alcohol use: No  . Drug use: No  . Sexual activity: Never    Birth control/protection: Abstinence  Lifestyle  . Physical activity:    Days per week: Not on file    Minutes per session: Not on file  . Stress: Not on file  Relationships  . Social connections:    Talks on phone: Not on file    Gets together: Not on file    Attends religious service: Not on file    Active member of club or organization: Not on file    Attends meetings of clubs or organizations: Not on file    Relationship status: Not on  file  . Intimate partner violence:    Fear of current or ex partner: Not on file    Emotionally abused: Not on file    Physically abused: Not on file    Forced sexual activity: Not on file  Other Topics Concern  . Not on file  Social History Narrative  . Not on file    Review of Systems: See history of present illness Physical Exam: Vital signs in last 24 hours: Temp:  [97.7 F (36.5 C)-101 F (38.3 C)] 98.7 F (37.1 C) (08/02 1630) Pulse Rate:  [80-117] 80 (08/02 1645) Resp:  [17-29] 24 (08/02 1645) BP: (72-150)/(54-83) 83/57 (08/02 1645) SpO2:  [94 %-100 %] 100 % (08/02 1645) Weight:  [65.8 kg (145  lb)] 65.8 kg (145 lb) (08/02 1252)   This is a very pleasant, but cachectic appearing individual in absolutely no evident distress lying on the ER stretcher.  Heart rate is only 85, but blood pressure after fluids is in the mid 06Y systolic.  There is mild to moderate pallor the chest is completely clear to auscultation, the heart is normal without murmur or arrhythmia.  The abdomen is nondistended and nontympanitic, soft and nontender, no guarding, or mass-effect.  No skin rashes are seen.  There is no evident focal neurologic deficit.  The patient is coherent and appropriate, mood is normal.  No peripheral edema.  Intake/Output from previous day: No intake/output data recorded. Intake/Output this shift: Total I/O In: 1315 [Blood:315; IV Piggyback:1000] Out: -   Lab Results: Recent Labs    04/17/18 1249  WBC 5.6  HGB 6.2*  HCT 19.2*  PLT 361   BMET Recent Labs    04/17/18 1249  NA 124*  K 4.0  CL 89*  CO2 26  GLUCOSE 146*  BUN 13  CREATININE 0.90  CALCIUM 7.5*   LFT Recent Labs    04/17/18 1249  PROT 5.5*  ALBUMIN 1.7*  AST 15  ALT 12  ALKPHOS 106  BILITOT 0.6   PT/INR No results for input(s): LABPROT, INR in the last 72 hours.  Studies/Results: Dg Chest Port 1 View  Result Date: 04/17/2018 CLINICAL DATA:  Shortness of breath.  Lightheaded. EXAM:  PORTABLE CHEST 1 VIEW COMPARISON:  03/30/2018. FINDINGS: Normal sized heart. Clear lungs with normal vascularity. Diffuse osteopenia and lower thoracic spine degenerative changes. IMPRESSION: No acute abnormality. Electronically Signed   By: Claudie Revering M.D.   On: 04/17/2018 16:26    Impression: Ongoing severe clinical activity of ulcerative colitis.  Doubt coexisting conditions such as CMV colitis or pseudomembranous colitis since the diarrhea is actually somewhat better.  It does appear, however, the patient has significant volume contraction likely contributing to his weakness.  At the moment, I do not feel that the overall picture is suggestive of toxic megacolon, since he is not tachycardic, not significantly febrile, does not have severe leukocytosis, is not distended, is not tympanitic, and is not experiencing abdominal pain.  Plan: 1.  Check KUB to look for evidence of colonic distention although, on exam, it does not seem to be present. 2.  Monitor labs (ordered) 3.  Continue azathioprine 75 mg daily (ordered) 4.  High-dose Solu-Medrol (80 mg IV twice daily, ordered) 5.  Close clinical follow-up.  Would have low threshold for further transfusion since he is very anemic, and his low albumin means that crystalloid will have only transient benefit for volume expansion. 6.  Our hope is that the patient will respond to IV steroids; if so, and he can successfully transition to high-dose oral steroids, this would act as sort of a "bridge" until the therapeutic effects of his azathioprine and Entyvio have a chance to occur. 7.  If the patient deteriorates clinically, consideration may have to be given to initiation of anti-TNF agents, and potentially even surgical consultation for possible emergent colectomy.  Case discussed with Dr. Florene Glen, and also earlier with Dr. Alessandra Bevels (before the fever and hypotension were noted).   LOS: 0 days   Cashae Weich V  04/17/2018, 5:05 PM   Pager  [phone] If no answer or after 5 PM call [phone]

## 2018-04-18 LAB — COMPREHENSIVE METABOLIC PANEL
ALK PHOS: 93 U/L (ref 38–126)
ALT: 12 U/L (ref 0–44)
AST: 17 U/L (ref 15–41)
Albumin: 1.5 g/dL — ABNORMAL LOW (ref 3.5–5.0)
Anion gap: 10 (ref 5–15)
BUN: 9 mg/dL (ref 6–20)
CHLORIDE: 97 mmol/L — AB (ref 98–111)
CO2: 24 mmol/L (ref 22–32)
Calcium: 7.6 mg/dL — ABNORMAL LOW (ref 8.9–10.3)
Creatinine, Ser: 0.83 mg/dL (ref 0.61–1.24)
GFR calc Af Amer: >60 mL/min (ref >60)
Glucose, Bld: 153 mg/dL — ABNORMAL HIGH (ref 70–99)
POTASSIUM: 3.6 mmol/L (ref 3.5–5.1)
Sodium: 131 mmol/L — ABNORMAL LOW (ref 135–145)
Total Bilirubin: 0.5 mg/dL (ref 0.3–1.2)
Total Protein: 5.2 g/dL — ABNORMAL LOW (ref 6.5–8.1)

## 2018-04-18 LAB — MAGNESIUM: Magnesium: 2 mg/dL (ref 1.7–2.4)

## 2018-04-18 LAB — CBC WITH DIFFERENTIAL/PLATELET
Basophils Absolute: 0 10*3/uL (ref 0.0–0.1)
Basophils Relative: 0 %
Eosinophils Absolute: 0 10*3/uL (ref 0.0–0.7)
Eosinophils Relative: 0 %
HEMATOCRIT: 26.7 % — AB (ref 39.0–52.0)
HEMOGLOBIN: 8.9 g/dL — AB (ref 13.0–17.0)
LYMPHS ABS: 0.2 10*3/uL — AB (ref 0.7–4.0)
LYMPHS PCT: 3 %
MCH: 28.2 pg (ref 26.0–34.0)
MCHC: 33.3 g/dL (ref 30.0–36.0)
MCV: 84.5 fL (ref 78.0–100.0)
Monocytes Absolute: 0.1 10*3/uL (ref 0.1–1.0)
Monocytes Relative: 2 %
NEUTROS ABS: 7.3 10*3/uL (ref 1.7–7.7)
NEUTROS PCT: 95 %
Platelets: 355 10*3/uL (ref 150–400)
RBC: 3.16 MIL/uL — AB (ref 4.22–5.81)
RDW: 13.9 % (ref 11.5–15.5)
WBC: 7.7 10*3/uL (ref 4.0–10.5)

## 2018-04-18 LAB — URINE CULTURE: Culture: NO GROWTH

## 2018-04-18 LAB — HIV ANTIBODY (ROUTINE TESTING W REFLEX): HIV Screen 4th Generation wRfx: NONREACTIVE

## 2018-04-18 LAB — PROCALCITONIN: Procalcitonin: 1.01 ng/mL

## 2018-04-18 MED ORDER — LACTATED RINGERS IV BOLUS
500.0000 mL | Freq: Once | INTRAVENOUS | Status: AC
  Administered 2018-04-18: 500 mL via INTRAVENOUS

## 2018-04-18 MED ORDER — SACCHAROMYCES BOULARDII 250 MG PO CAPS
250.0000 mg | ORAL_CAPSULE | Freq: Two times a day (BID) | ORAL | Status: DC
  Administered 2018-04-18 – 2018-04-24 (×13): 250 mg via ORAL
  Filled 2018-04-18 (×13): qty 1

## 2018-04-18 MED ORDER — POTASSIUM CHLORIDE CRYS ER 20 MEQ PO TBCR
40.0000 meq | EXTENDED_RELEASE_TABLET | Freq: Once | ORAL | Status: AC
  Administered 2018-04-18: 40 meq via ORAL
  Filled 2018-04-18: qty 2

## 2018-04-18 NOTE — Consult Note (Signed)
PULMONARY / CRITICAL CARE MEDICINE   Name: Zachary Horn MRN: [id number] DOB: 09-Aug-1961 PCP Merrilee Seashore, MD LOS 1 as of 04/18/2018     ADMISSION DATE:  04/17/2018 CONSULTATION DATE:  04/17/2018   REFERRING MD:  [facility] Dr Marcelline Deist  CHIEF COMPLAINT:  Circulatory shock  brief   - Zachary Horn , 57 y.o. , with dob Feb 15, 1961 and male ,Not Hispanic or Latino from [place] [address] - presents to [facility] -> REcent dx of Ulcerative colitis. On prednisone, immuran and entviyuo. Presnted to ER .04/17/2018 with passing out spells and mild bleeding with trips to bathroom x 1 month and found to be febrile 101 and hgb 6. Rx wih 1 unit PRBC an dfluis and still hypotensive but otherwise feeling well. CCM called for consultation for circultory shock  EVENT' 04/17/2018 - admit   SUBJECTIVE/OVERNIGHT/INTERVAL HX 8/3 - had 2BM without blood. RN reports coming down on pressors. Fever curve better  VITAL SIGNS: BP 101/68   Pulse 66   Temp 98.4 F (36.9 C) (Oral)   Resp 19   Ht 5' 8"  (1.727 m)   Wt 61.5 kg (135 lb 9.3 oz)   SpO2 99%   BMI 20.62 kg/m   HEMODYNAMICS:    VENTILATOR SETTINGS:    INTAKE / OUTPUT: I/O last 3 completed shifts: In: 6018.6 [I.V.:1598.1; Blood:1132.5; IV Piggyback:3288.1] Out: 650 [Urine:650]     EXAM  General Appearance:    Looks well. Reading [employer]  Head:    Normocephalic, without obvious abnormality, atraumatic  Eyes:    PERRL - yes, conjunctiva/corneas - clear      Ears:    Normal external ear canals, both ears  Nose:   NG tube - no  Throat:  ETT TUBE - no , OG tube - no  Neck:   Supple,  No enlargement/tenderness/nodules     Lungs:     Clear to auscultation bilaterally,   Chest wall:    No deformity  Heart:    S1 and S2 normal, no murmur, CVP - no.  Pressors - neo 43mg  Abdomen:     Soft, no masses, no organomegaly  Genitalia:    Not done  Rectal:   not done  Extremities:   Extremities- intact     Skin:   Intact  in exposed areas .      Neurologic:   Sedation - none -> RASS - +1 . Moves all 4s - yes. CAM-ICU - neg . Orientation - x3+          LABS  PULMONARY No results for input(s): PHART, PCO2ART, PO2ART, HCO3, TCO2, O2SAT in the last 168 hours.  Invalid input(s): PCO2, PO2  CBC Recent Labs  Lab 04/17/18 1249 04/17/18 1815 04/18/18 0335  HGB 6.2* 6.7* 8.9*  HCT 19.2* 19.9* 26.7*  WBC 5.6  --  7.7  PLT 361  --  355    COAGULATION No results for input(s): INR in the last 168 hours.  CARDIAC  No results for input(s): TROPONINI in the last 168 hours. No results for input(s): PROBNP in the last 168 hours.   CHEMISTRY Recent Labs  Lab 04/17/18 1249 04/17/18 1838 04/18/18 0335  NA 124*  --  131*  K 4.0  --  3.6  CL 89*  --  97*  CO2 26  --  24  GLUCOSE 146*  --  153*  BUN 13  --  9  CREATININE 0.90  --  0.83  CALCIUM 7.5*  --  7.6*  MG  --  1.7 2.0   Estimated Creatinine Clearance: 83.4 mL/min (by C-G formula based on SCr of 0.83 mg/dL).   LIVER Recent Labs  Lab 04/17/18 1249 04/18/18 0335  AST 15 17  ALT 12 12  ALKPHOS 106 93  BILITOT 0.6 0.5  PROT 5.5* 5.2*  ALBUMIN 1.7* 1.5*     INFECTIOUS Recent Labs  Lab 04/17/18 1815 04/17/18 1838 04/17/18 2023 04/18/18 0335  LATICACIDVEN 1.3  --  1.7  --   PROCALCITON  --  1.33  --  1.01     ENDOCRINE CBG (last 3)  No results for input(s): GLUCAP in the last 72 hours.       IMAGING x48h  - image(s) personally visualized  -   highlighted in bold Dg Abd 1 View  Result Date: 04/17/2018 CLINICAL DATA:  Hypotension fever with ulcerative colitis EXAM: ABDOMEN - 1 VIEW COMPARISON:  None. FINDINGS: Air-filled but nondistended bowel in the central abdomen. Slightly featureless appearance of right abdominal loops. Phleboliths in the pelvis. IMPRESSION: Nonobstructed gas pattern. Slightly featureless appearance of bowel within the central and right abdomen, possible bowel wall thickening. Electronically  Signed   By: Donavan Foil M.D.   On: 04/17/2018 18:28   Dg Chest Port 1 View  Result Date: 04/17/2018 CLINICAL DATA:  Shortness of breath.  Lightheaded. EXAM: PORTABLE CHEST 1 VIEW COMPARISON:  03/30/2018. FINDINGS: Normal sized heart. Clear lungs with normal vascularity. Diffuse osteopenia and lower thoracic spine degenerative changes. IMPRESSION: No acute abnormality. Electronically Signed   By: Claudie Revering M.D.   On: 04/17/2018 16:26       ASSESSMENT and PLAN  Shock circulatory ([place]) Rapidly better after neo, fluid bolus and 125cc/h LR and solumedrol and antibiotics Still pressor dependent but weaning off No active bleeding No blood in stool   PLAN 1L fluid bolus and then 125cch/ LR Solumderol per GI  abx as below Neo per PIV but weanin off - map goal > 65 - PRBC for hgb </= 6.9gm%    - exceptions are   -  if ACS susepcted/confirmed then transfuse for hgb </= 8.0gm%,  or    -  active bleeding with hemodynamic instability, then transfuse regardless of hemoglobin value   At at all times try to transfuse 1 unit prbc as possible with exception of active hemorrhage    Anti-infectives (From admission, onward)   Start     Dose/Rate Route Frequency Ordered Stop   04/18/18 0500  vancomycin (VANCOCIN) IVPB 750 mg/150 ml premix     750 mg 150 mL/hr over 60 Minutes Intravenous Every 12 hours 04/17/18 1621     04/17/18 2200  piperacillin-tazobactam (ZOSYN) IVPB 3.375 g     3.375 g 12.5 mL/hr over 240 Minutes Intravenous Every 8 hours 04/17/18 1620     04/17/18 1630  vancomycin (VANCOCIN) 1,250 mg in sodium chloride 0.9 % 250 mL IVPB     1,250 mg 166.7 mL/hr over 90 Minutes Intravenous  Once 04/17/18 1620     04/17/18 1630  piperacillin-tazobactam (ZOSYN) IVPB 3.375 g     3.375 g 100 mL/hr over 30 Minutes Intravenous  Once 04/17/18 1620 04/17/18 1714           FAMILY  - Updates: 04/18/2018 --> patietn at bedside  - Inter-disciplinary family meet or Palliative Care meeting  due by:  DAy 7. Current LOS is LOS 1 days  CODE STATUS    Code Status Orders  (  From admission, onward)        Start     Ordered   04/17/18 1747  Full code  Continuous     04/17/18 1746    Code Status History    This patient has a current code status but no historical code status.        DISPO Keep in ICU . PCCM will sign off when off pressors      The patient is critically ill with multiple organ systems failure and requires high complexity decision making for assessment and support, frequent evaluation and titration of therapies, application of advanced monitoring technologies and extensive interpretation of multiple databases.   Critical Care Time devoted to patient care services described in this note is  30  Minutes. This time reflects time of care of this signee Dr Brand Males. This critical care time does not reflect procedure time, or teaching time or supervisory time of PA/NP/Med student/Med Resident etc but could involve care discussion time    Dr. Brand Males, M.D., [facility].C.P Pulmonary and Critical Care Medicine Staff Physician [facility] Pager: [phone], If no answer or between  15:00h - 7:00h: call [phone]  04/18/2018 9:41 AM

## 2018-04-18 NOTE — Progress Notes (Signed)
PROGRESS NOTE    Zachary Horn  QHU:[id number] DOB: 02/09/1961 DOA: 04/17/2018 PCP: Merrilee Seashore, MD     Brief Narrative:  Zachary Horn is a 57 y.o. male with medical history significant of recently diagnosed ulcerative colitis presenting with SOB, lightheadedness, fainting spells over the past month.  He notes that over the past month, he has had shortness of breath, lightheadedness with standing, fainting spells.  These have gotten worse over the past 3 to 4 days.  He notes worsening shortness of breath with exertion.  He has begun to walk with a cane because of his general fatigue.  He also uses an office chair on wheels to get around his kitchen.  He fainted 3-4 times over the past few days when getting out of bed at night.  He notes abdominal pain from the colitis which he notes is a little bit better.  Diarrhea is also a little bit better.  The stool is dark brown in color and he has not seen any blood recently.  In the emergency department, he developed fever, hypotension.  He was admitted to stepdown unit, PCCM consulted as well as GI.  He was started on broad-spectrum antibiotics, IV Solu-Medrol, given blood transfusion as well as pressors.  New events last 24 hours / Subjective: Feeling a little bit better this morning.  Tolerated liquid diet without any issues.  Had bowel movement yesterday without any blood.  Not been out of bed yet today  Assessment & Plan:   Active Problems:   GI bleed   Shock circulatory (HCC)  Hypovolemic and septic shock -Given 2 units packed red blood cells, IV fluids -Remains on pressors, weaning today -Evidence of GI bleeding, positive fecal occult, hemoglobin 6.2 on admission, received packed red blood cells with improvement in hemoglobin -Fever upon presentation, procalcitonin 1.33.  WBC normal.  Blood and urine culture pending.  Chest x-ray unremarkable. Abdominal x-ray revealed nonobstructive gas pattern, possible bowel wall thickening.  Continue broad-spectrum antibiotic Vanco, Zosyn  GI bleed, history of ulcerative colitis -GI following -Continue azathioprine, IV Solu-Medrol  Syncope -Likely in setting of orthostatic due to anemia, hypotension, sepsis  Hyponatremia -Improving with IV fluids  DVT prophylaxis: SCD Code Status: Full Family Communication: No family at bedside Disposition Plan: Pending clinical improvement, remain in stepdown for pressors    Consultants:   PCCM   GI   Procedures:   None   Antimicrobials:  Anti-infectives (From admission, onward)   Start     Dose/Rate Route Frequency Ordered Stop   04/18/18 0500  vancomycin (VANCOCIN) IVPB 750 mg/150 ml premix     750 mg 150 mL/hr over 60 Minutes Intravenous Every 12 hours 04/17/18 1621     04/17/18 2200  piperacillin-tazobactam (ZOSYN) IVPB 3.375 g     3.375 g 12.5 mL/hr over 240 Minutes Intravenous Every 8 hours 04/17/18 1620     04/17/18 1630  vancomycin (VANCOCIN) 1,250 mg in sodium chloride 0.9 % 250 mL IVPB     1,250 mg 166.7 mL/hr over 90 Minutes Intravenous  Once 04/17/18 1620 04/17/18 1911   04/17/18 1630  piperacillin-tazobactam (ZOSYN) IVPB 3.375 g     3.375 g 100 mL/hr over 30 Minutes Intravenous  Once 04/17/18 1620 04/17/18 1714        Objective: Vitals:   04/18/18 0800 04/18/18 0815 04/18/18 0900 04/18/18 0915  BP:  95/62 101/68 98/70  Pulse: 69 69 66 62  Resp: (!) 22 (!) 25 19 (!) 23  Temp: 98.4 F (  36.9 C)     TempSrc: Oral     SpO2: 98% 98% 99% 99%  Weight:      Height:        Intake/Output Summary (Last 24 hours) at 04/18/2018 1025 Last data filed at 04/18/2018 0905 Gross per 24 hour  Intake 6859.1 ml  Output 650 ml  Net 6209.1 ml   Filed Weights   04/17/18 1252 04/17/18 1715 04/18/18 0500  Weight: 65.8 kg (145 lb) 55.8 kg (123 lb 0.3 oz) 61.5 kg (135 lb 9.3 oz)    Examination:  General exam: Appears calm and comfortable  Respiratory system: Clear to auscultation. Respiratory effort  normal. Cardiovascular system: S1 & S2 heard, RRR. No JVD, murmurs, rubs, gallops or clicks. No pedal edema. Gastrointestinal system: Abdomen is nondistended, soft and nontender. No organomegaly or masses felt. Normal bowel sounds heard. Central nervous system: Alert and oriented. No focal neurological deficits. Extremities: Symmetric 5 x 5 power. Skin: No rashes, lesions or ulcers Psychiatry: Judgement and insight appear normal. Mood & affect appropriate.   Data Reviewed: I have personally reviewed following labs and imaging studies  CBC: Recent Labs  Lab 04/17/18 1249 04/17/18 1815 04/18/18 0335  WBC 5.6  --  7.7  NEUTROABS  --   --  7.3  HGB 6.2* 6.7* 8.9*  HCT 19.2* 19.9* 26.7*  MCV 85.3  --  84.5  PLT 361  --  435   Basic Metabolic Panel: Recent Labs  Lab 04/17/18 1249 04/17/18 1838 04/18/18 0335  NA 124*  --  131*  K 4.0  --  3.6  CL 89*  --  97*  CO2 26  --  24  GLUCOSE 146*  --  153*  BUN 13  --  9  CREATININE 0.90  --  0.83  CALCIUM 7.5*  --  7.6*  MG  --  1.7 2.0   GFR: Estimated Creatinine Clearance: 83.4 mL/min (by C-G formula based on SCr of 0.83 mg/dL). Liver Function Tests: Recent Labs  Lab 04/17/18 1249 04/18/18 0335  AST 15 17  ALT 12 12  ALKPHOS 106 93  BILITOT 0.6 0.5  PROT 5.5* 5.2*  ALBUMIN 1.7* 1.5*   No results for input(s): LIPASE, AMYLASE in the last 168 hours. No results for input(s): AMMONIA in the last 168 hours. Coagulation Profile: No results for input(s): INR, PROTIME in the last 168 hours. Cardiac Enzymes: No results for input(s): CKTOTAL, CKMB, CKMBINDEX, TROPONINI in the last 168 hours. BNP (last 3 results) No results for input(s): PROBNP in the last 8760 hours. HbA1C: No results for input(s): HGBA1C in the last 72 hours. CBG: No results for input(s): GLUCAP in the last 168 hours. Lipid Profile: No results for input(s): CHOL, HDL, LDLCALC, TRIG, CHOLHDL, LDLDIRECT in the last 72 hours. Thyroid Function Tests: No  results for input(s): TSH, T4TOTAL, FREET4, T3FREE, THYROIDAB in the last 72 hours. Anemia Panel: No results for input(s): VITAMINB12, FOLATE, FERRITIN, TIBC, IRON, RETICCTPCT in the last 72 hours. Sepsis Labs: Recent Labs  Lab 04/17/18 1815 04/17/18 1838 04/17/18 2023 04/18/18 0335  PROCALCITON  --  1.33  --  1.01  LATICACIDVEN 1.3  --  1.7  --     Recent Results (from the past 240 hour(s))  MRSA PCR Screening     Status: None   Collection Time: 04/17/18  6:02 PM  Result Value Ref Range Status   MRSA by PCR NEGATIVE NEGATIVE Final    Comment:  The GeneXpert MRSA Assay (FDA approved for NASAL specimens only), is one component of a comprehensive MRSA colonization surveillance program. It is not intended to diagnose MRSA infection nor to guide or monitor treatment for MRSA infections. Performed at [facility], [place] [address], [place], [place] [address]        Radiology Studies: Dg Abd 1 View  Result Date: 04/17/2018 CLINICAL DATA:  Hypotension fever with ulcerative colitis EXAM: ABDOMEN - 1 VIEW COMPARISON:  None. FINDINGS: Air-filled but nondistended bowel in the central abdomen. Slightly featureless appearance of right abdominal loops. Phleboliths in the pelvis. IMPRESSION: Nonobstructed gas pattern. Slightly featureless appearance of bowel within the central and right abdomen, possible bowel wall thickening. Electronically Signed   By: Donavan Foil M.D.   On: 04/17/2018 18:28   Dg Chest Port 1 View  Result Date: 04/17/2018 CLINICAL DATA:  Shortness of breath.  Lightheaded. EXAM: PORTABLE CHEST 1 VIEW COMPARISON:  03/30/2018. FINDINGS: Normal sized heart. Clear lungs with normal vascularity. Diffuse osteopenia and lower thoracic spine degenerative changes. IMPRESSION: No acute abnormality. Electronically Signed   By: Claudie Revering M.D.   On: 04/17/2018 16:26      Scheduled Meds: . sodium chloride   Intravenous Once  . azaTHIOprine  75 mg Oral  Daily  . feeding supplement  1 Container Oral TID BM  . methylPREDNISolone (SOLU-MEDROL) injection  80 mg Intravenous Q12H  . potassium chloride  40 mEq Oral Once   Continuous Infusions: . sodium chloride    . sodium chloride Stopped (04/18/18 0641)  . lactated ringers    . lactated ringers 125 mL/hr at 04/18/18 0905  . phenylephrine (NEO-SYNEPHRINE) Adult infusion 40 mcg/min (04/18/18 0905)  . piperacillin-tazobactam (ZOSYN)  IV 12.5 mL/hr at 04/18/18 0905  . vancomycin Stopped (04/18/18 7253)     LOS: 1 day    Time spent: 45 minutes   Dessa Phi, DO [facility] [email or web address] Password [email or web address] 04/18/2018, 10:25 AM

## 2018-04-18 NOTE — Progress Notes (Signed)
The patient looks better today.  His color is better, following transfusion.  Hemoglobin is improved at 8.9.  The diarrhea has slowed down somewhat.  His KUB yesterday did not show any evidence of distention or toxic megacolon.  T-max 99.8 for the past 24 hours.  No tachycardia.  On exam, no evident distress, no frank pallor, abdomen without distention or tenderness.  Impression: Ulcerative pancolitis, mildly clinically improved while on high-dose IV steroids.  Plan: Add probiotics in view of antibiotic therapy (ordered).  Consider stopping antibiotics (which could be contributing to diarrhea) in the next day or so, if he remains without fever.  Cleotis Nipper, M.D. Pager [phone] If no answer or after 5 PM call [phone]

## 2018-04-19 ENCOUNTER — Inpatient Hospital Stay: Payer: Self-pay

## 2018-04-19 ENCOUNTER — Inpatient Hospital Stay (HOSPITAL_COMMUNITY): Payer: BLUE CROSS/BLUE SHIELD

## 2018-04-19 DIAGNOSIS — E440 Moderate protein-calorie malnutrition: Secondary | ICD-10-CM

## 2018-04-19 DIAGNOSIS — R001 Bradycardia, unspecified: Secondary | ICD-10-CM

## 2018-04-19 DIAGNOSIS — E878 Other disorders of electrolyte and fluid balance, not elsewhere classified: Secondary | ICD-10-CM

## 2018-04-19 DIAGNOSIS — I340 Nonrheumatic mitral (valve) insufficiency: Secondary | ICD-10-CM

## 2018-04-19 LAB — CORTISOL: CORTISOL PLASMA: 7.2 ug/dL

## 2018-04-19 LAB — CBC WITH DIFFERENTIAL/PLATELET
BASOS ABS: 0 10*3/uL (ref 0.0–0.1)
BASOS PCT: 0 %
EOS PCT: 0 %
Eosinophils Absolute: 0 10*3/uL (ref 0.0–0.7)
HCT: 26.6 % — ABNORMAL LOW (ref 39.0–52.0)
Hemoglobin: 8.9 g/dL — ABNORMAL LOW (ref 13.0–17.0)
Lymphocytes Relative: 6 %
Lymphs Abs: 0.7 10*3/uL (ref 0.7–4.0)
MCH: 28.3 pg (ref 26.0–34.0)
MCHC: 33.5 g/dL (ref 30.0–36.0)
MCV: 84.7 fL (ref 78.0–100.0)
Monocytes Absolute: 0.2 10*3/uL (ref 0.1–1.0)
Monocytes Relative: 2 %
Neutro Abs: 11 10*3/uL — ABNORMAL HIGH (ref 1.7–7.7)
Neutrophils Relative %: 92 %
PLATELETS: 323 10*3/uL (ref 150–400)
RBC: 3.14 MIL/uL — AB (ref 4.22–5.81)
RDW: 14.2 % (ref 11.5–15.5)
WBC: 11.9 10*3/uL — AB (ref 4.0–10.5)

## 2018-04-19 LAB — BASIC METABOLIC PANEL
ANION GAP: 10 (ref 5–15)
BUN: 10 mg/dL (ref 6–20)
CALCIUM: 7.9 mg/dL — AB (ref 8.9–10.3)
CHLORIDE: 97 mmol/L — AB (ref 98–111)
CO2: 23 mmol/L (ref 22–32)
Creatinine, Ser: 0.9 mg/dL (ref 0.61–1.24)
GFR calc non Af Amer: >60 mL/min (ref >60)
Glucose, Bld: 125 mg/dL — ABNORMAL HIGH (ref 70–99)
POTASSIUM: 4 mmol/L (ref 3.5–5.1)
Sodium: 130 mmol/L — ABNORMAL LOW (ref 135–145)

## 2018-04-19 LAB — HEPATIC FUNCTION PANEL
ALBUMIN: 1.3 g/dL — AB (ref 3.5–5.0)
ALT: 12 U/L (ref 0–44)
AST: 16 U/L (ref 15–41)
Alkaline Phosphatase: 90 U/L (ref 38–126)
Bilirubin, Direct: 0.2 mg/dL (ref 0.0–0.2)
Indirect Bilirubin: 0.1 mg/dL — ABNORMAL LOW (ref 0.3–0.9)
TOTAL PROTEIN: 5 g/dL — AB (ref 6.5–8.1)
Total Bilirubin: 0.3 mg/dL (ref 0.3–1.2)

## 2018-04-19 LAB — PROCALCITONIN: Procalcitonin: 0.76 ng/mL

## 2018-04-19 LAB — ECHOCARDIOGRAM COMPLETE
HEIGHTINCHES: 68 in
Weight: 2169.33 oz

## 2018-04-19 LAB — PHOSPHORUS: PHOSPHORUS: 3.5 mg/dL (ref 2.5–4.6)

## 2018-04-19 LAB — TROPONIN I: Troponin I: <0.03 ng/mL (ref <0.03)

## 2018-04-19 LAB — LACTIC ACID, PLASMA: Lactic Acid, Venous: 1.7 mmol/L (ref 0.5–1.9)

## 2018-04-19 LAB — MAGNESIUM: MAGNESIUM: 1.8 mg/dL (ref 1.7–2.4)

## 2018-04-19 MED ORDER — SODIUM CHLORIDE 0.9% FLUSH
10.0000 mL | Freq: Two times a day (BID) | INTRAVENOUS | Status: DC
  Administered 2018-04-20 – 2018-04-24 (×4): 10 mL

## 2018-04-19 MED ORDER — LACTATED RINGERS IV BOLUS
1000.0000 mL | Freq: Once | INTRAVENOUS | Status: AC
  Administered 2018-04-19: 1000 mL via INTRAVENOUS

## 2018-04-19 MED ORDER — MAGNESIUM SULFATE 2 GM/50ML IV SOLN
2.0000 g | Freq: Once | INTRAVENOUS | Status: AC
  Administered 2018-04-19: 2 g via INTRAVENOUS
  Filled 2018-04-19: qty 50

## 2018-04-19 MED ORDER — SODIUM CHLORIDE 0.9% FLUSH
10.0000 mL | INTRAVENOUS | Status: DC | PRN
Start: 2018-04-19 — End: 2018-04-24

## 2018-04-19 MED ORDER — NOREPINEPHRINE 4 MG/250ML-% IV SOLN
0.0000 ug/min | INTRAVENOUS | Status: DC
  Administered 2018-04-19: 5 ug/min via INTRAVENOUS
  Administered 2018-04-19: 2 ug/min via INTRAVENOUS
  Filled 2018-04-19 (×3): qty 250

## 2018-04-19 MED ORDER — CHLORHEXIDINE GLUCONATE CLOTH 2 % EX PADS
6.0000 | MEDICATED_PAD | Freq: Every day | CUTANEOUS | Status: DC
  Administered 2018-04-19 – 2018-04-24 (×5): 6 via TOPICAL

## 2018-04-19 MED ORDER — SODIUM CHLORIDE 0.9 % IV BOLUS
1000.0000 mL | Freq: Once | INTRAVENOUS | Status: AC
  Administered 2018-04-19: 1000 mL via INTRAVENOUS

## 2018-04-19 MED ORDER — HYDROCORTISONE NA SUCCINATE PF 100 MG IJ SOLR
100.0000 mg | Freq: Three times a day (TID) | INTRAMUSCULAR | Status: DC
  Administered 2018-04-19 – 2018-04-22 (×9): 100 mg via INTRAVENOUS
  Filled 2018-04-19 (×9): qty 2

## 2018-04-19 NOTE — Progress Notes (Addendum)
GASTROENTEROLOGY PROGRESS NOTE  Problem:   Ulcerative pancolitis.  Bradycardia and hypotension.  Subjective: Doing better with respect to diarrhea, although he did have an explosive, incontinent bowel movement around 4 this morning (no bowel movement since then).  His feeling is that he is getting better.  Per PCCM note, bleeding with bowel movements has resolved.  He is on high-dose IV steroids and probiotics, as well as his azathioprine (started 2 weeks ago) and Entyvio (first infusion about a week ago).  He is on a soft diet, and seems to be tolerating it well.  He does not note any association between food intake and any exacerbation of diarrheal symptoms.  Objective: Systolic blood pressure 837, on pressors.  Lying in bed in no distress, alert.  Does not look at all shocky.  Heart rate 47.  Abdomen is nondistended, soft, nontender.  Assessment: 1.  Gradual symptomatic improvement in colitis symptoms on high-dose steroids. 2.  Unexplained hypotension and bradycardia, being followed by PCCM.  Plan: 1.  Continue current management.  Our hope is that high-dose steroids will serve as a "bridge" to keep him in control of GI symptoms while waiting for the azathioprine and Entyvio to kick in, in terms of clinical effectiveness.  Otherwise, he might need to go on alternative therapy, such as anti-TNF medication.  2.  From our standpoint, it would be preferable to stop his antibiotics (Zosyn) as soon as PCCM feels comfortable doing so.  The antibiotics are possibly contributing to his diarrhea.  Zachary Horn, M.D. 04/19/2018 11:19 AM  Pager [phone] If no answer or after 5 PM call [phone]

## 2018-04-19 NOTE — Consult Note (Signed)
PULMONARY / CRITICAL CARE MEDICINE   Name: Zachary Horn MRN: [id number] DOB: 12-05-1960 PCP Zachary Seashore, MD LOS 2 as of 04/19/2018     ADMISSION DATE:  04/17/2018 CONSULTATION DATE:  04/17/2018   REFERRING MD:  Zachary Horn  CHIEF COMPLAINT:  Circulatory shock  brief   - Zachary Horn , 57 y.o. , with dob Nov 24, 1960 and male ,Not Hispanic or Latino from [place] [address] - presents to [place] ICU -> REcent dx of Ulcerative colitis. On prednisone, immuran and entviyuo. Presnted to ER .04/17/2018 with passing out spells and mild bleeding with trips to bathroom x 1 month and found to be febrile 101 and hgb 6. Rx wih 1 unit PRBC an dfluis and still hypotensive but otherwise feeling well. CCM called for consultation for circultory shock  EVENT' 04/17/2018 - admit 8/3 - had 2BM without blood. RN reports coming down on pressors. Fever curve better  SUBJECTIVE/OVERNIGHT/INTERVAL HX 8/4 - afebrile but ovenight significant bradycardia - lowest was 36/min sinus on monitor per RN and sign out eMD. On EKG this am - HR 37, sinus bradycardia. Neo changed to levophed and needing 24mg to maintain BP/HR of 50/min. Says he feels a bit more fatigued than yesterday. Otherwise had 1BM in past 24h - no bleeding. No visible bleeding.   Prior hx: says normal childhood sports of baseball and football. Last few to several years run 6-8 miles per week. Resting HR at day time is 60/min   VITAL SIGNS: BP (!) 80/53   Pulse (!) 49   Temp (!) 97.5 F (36.4 C) (Oral)   Resp 17   Ht 5' 8"  (1.727 m)   Wt 61.5 kg (135 lb 9.3 oz)   SpO2 100%   BMI 20.62 kg/m   HEMODYNAMICS:    VENTILATOR SETTINGS:    INTAKE / OUTPUT: I/O last 3 completed shifts: In: 10521 [P.O.:720; I.V.:5078; Blood:817.5; IV Piggyback:3905.5] Out: 1500 [Urine:1500]    EXAM   General Appearance:    Looks deconditioned. But same as yesterday.  Head:    Normocephalic, without obvious abnormality, atraumatic   Eyes:    PERRL - yes, conjunctiva/corneas - clear      Ears:    Normal external ear canals, both ears  Nose:   NG tube - no  Throat:  ETT TUBE - no , OG tube - no  Neck:   Supple,  No enlargement/tenderness/nodules     Lungs:     Clear to auscultation bilaterally,  Chest wall:    No deformity  Heart:    S1 and S2 normal, no murmur, CVP - no.  Pressors - levophed 276m. HR 37-50/min. EKG - sinus bradycardia  Abdomen:     Soft, no masses, no organomegaly  Genitalia:    Not done  Rectal:   not done  Extremities:   Extremities- intct     Skin:   Intact in exposed areas . Sacral area - intact     Neurologic:   Sedation - none -> RASS - +1 . Moves all 4s - yes. CAM-ICU - neg . Orientation - x 3+             LABS  PULMONARY No results for input(s): PHART, PCO2ART, PO2ART, HCO3, TCO2, O2SAT in the last 168 hours.  Invalid input(s): PCO2, PO2  CBC Recent Labs  Lab 04/17/18 1249 04/17/18 1815 04/18/18 0335 04/19/18 0218  HGB 6.2* 6.7* 8.9* 8.9*  HCT 19.2* 19.9* 26.7* 26.6*  WBC  5.6  --  7.7 11.9*  PLT 361  --  355 323    COAGULATION No results for input(s): INR in the last 168 hours.  CARDIAC  No results for input(s): TROPONINI in the last 168 hours. No results for input(s): PROBNP in the last 168 hours.   CHEMISTRY Recent Labs  Lab 04/17/18 1249 04/17/18 1838 04/18/18 0335 04/19/18 0218  NA 124*  --  131* 130*  K 4.0  --  3.6 4.0  CL 89*  --  97* 97*  CO2 26  --  24 23  GLUCOSE 146*  --  153* 125*  BUN 13  --  9 10  CREATININE 0.90  --  0.83 0.90  CALCIUM 7.5*  --  7.6* 7.9*  MG  --  1.7 2.0 1.8  PHOS  --   --   --  3.5   Estimated Creatinine Clearance: 76.9 mL/min (by C-G formula based on SCr of 0.9 mg/dL).   LIVER Recent Labs  Lab 04/17/18 1249 04/18/18 0335 04/19/18 0218  AST 15 17 16   ALT 12 12 12   ALKPHOS 106 93 90  BILITOT 0.6 0.5 0.3  PROT 5.5* 5.2* 5.0*  ALBUMIN 1.7* 1.5* 1.3*     INFECTIOUS Recent Labs  Lab 04/17/18 1815  04/17/18 1838 04/17/18 2023 04/18/18 0335 04/19/18 0218  LATICACIDVEN 1.3  --  1.7  --   --   PROCALCITON  --  1.33  --  1.01 0.76     ENDOCRINE CBG (last 3)  No results for input(s): GLUCAP in the last 72 hours.       IMAGING x48h  - image(s) personally visualized  -   highlighted in bold Dg Abd 1 View  Result Date: 04/17/2018 CLINICAL DATA:  Hypotension fever with ulcerative colitis EXAM: ABDOMEN - 1 VIEW COMPARISON:  None. FINDINGS: Air-filled but nondistended bowel in the central abdomen. Slightly featureless appearance of right abdominal loops. Phleboliths in the pelvis. IMPRESSION: Nonobstructed gas pattern. Slightly featureless appearance of bowel within the central and right abdomen, possible bowel wall thickening. Electronically Signed   By: Zachary Horn M.D.   On: 04/17/2018 18:28   Dg Chest Port 1 View  Result Date: 04/17/2018 CLINICAL DATA:  Shortness of breath.  Lightheaded. EXAM: PORTABLE CHEST 1 VIEW COMPARISON:  03/30/2018. FINDINGS: Normal sized heart. Clear lungs with normal vascularity. Diffuse osteopenia and lower thoracic spine degenerative changes. IMPRESSION: No acute abnormality. Electronically Signed   By: Zachary Horn M.D.   On: 04/17/2018 16:26   [place] Ekg Site Rite  Result Date: 04/19/2018 If Site Rite image not attached, placement could not be confirmed due to current cardiac rhythm.      ASSESSMENT and PLAN  Shock circulatory (HCC) ONgoing circulatory shhock despite no active bleeding and improvement in PCT  And fever. Has associated sinus bradycardia - raising picture of EP related issues but is more likely bradycardia physiologic   PLAN 1L more fluid bolus and then 125cch/ LR Change Solumedrol to hydrocort  Abx as below Agree with neo change to levophed - MAP goal > 65 STat CBC check, trop check, lactate ched   PRBC for hgb </= 6.9gm%    - exceptions are   -  if ACS susepcted/confirmed then transfuse for hgb </= 8.0gm%,  or    -  active  bleeding with hemodynamic instability, then transfuse regardless of hemoglobin value   At at all times try to transfuse 1 unit prbc as possible with exception of active  hemorrhage Place picc line   Anti-infectives (From admission, onward)   Start     Dose/Rate Route Frequency Ordered Stop   04/18/18 0500  vancomycin (VANCOCIN) IVPB 750 mg/150 ml premix     750 mg 150 mL/hr over 60 Minutes Intravenous Every 12 hours 04/17/18 1621     04/17/18 2200  piperacillin-tazobactam (ZOSYN) IVPB 3.375 g     3.375 g 12.5 mL/hr over 240 Minutes Intravenous Every 8 hours 04/17/18 1620     04/17/18 1630  vancomycin (VANCOCIN) 1,250 mg in sodium chloride 0.9 % 250 mL IVPB     1,250 mg 166.7 mL/hr over 90 Minutes Intravenous  Once 04/17/18 1620     04/17/18 1630  piperacillin-tazobactam (ZOSYN) IVPB 3.375 g     3.375 g 100 mL/hr over 30 Minutes Intravenous  Once 04/17/18 1620 04/17/18 1714       Sinus bradycardia Appears to have overnight sinus bradycardia - likely physiologic given atheletic activities but with pressor need RAI a possibility given non-toxic appearance and lack of bleeding  Plan Stat troponin Stat ECHO Stat CBC Stat cortisol (noted on solumedrol/pred since July 2019)  -  - will change to hydrocort Optimize lytes - replace mag 04/19/2018 Depending on above, call cards  Electrolyte imbalance Mag is  < 2gm%  Plan Replete mag      FAMILY  - Updates: 04/19/2018 -->patient at bedsise   - Inter-disciplinary family meet or Palliative Care meeting due by:  DAy 7. Current LOS is LOS 2 days  CODE STATUS    Code Status Orders  (From admission, onward)        Start     Ordered   04/17/18 1747  Full code  Continuous     04/17/18 1746    Code Status History    This patient has a current code status but no historical code status.        DISPO Keep in ICU . PCCM will sign off when off pressors     The patient is critically ill with multiple organ systems failure  and requires high complexity decision making for assessment and support, frequent evaluation and titration of therapies, application of advanced monitoring technologies and extensive interpretation of multiple databases.   Critical Care Time devoted to patient care services described in this note is  30  Minutes. This time reflects time of care of this signee Dr Brand Males. This critical care time does not reflect procedure time, or teaching time or supervisory time of PA/NP/Med student/Med Resident etc but could involve care discussion time    Dr. Brand Males, M.D., [facility]C.P Pulmonary and Critical Care Medicine Staff Physician [place] [facility] Pager: [phone], If no answer or between  15:00h - 7:00h: call [phone]  04/19/2018 8:06 AM

## 2018-04-19 NOTE — Progress Notes (Signed)
PROGRESS NOTE    Zachary Horn  HBZ:[id number] DOB: 01-29-1961 DOA: 04/17/2018 PCP: Merrilee Seashore, MD     Brief Narrative:  Zachary Horn is a 57 y.o. male with medical history significant of recently diagnosed ulcerative colitis presenting with SOB, lightheadedness, fainting spells over the past month.  He notes that over the past month, he has had shortness of breath, lightheadedness with standing, fainting spells.  These have gotten worse over the past 3 to 4 days.  He notes worsening shortness of breath with exertion.  He has begun to walk with a cane because of his general fatigue.  He also uses an office chair on wheels to get around his kitchen.  He fainted 3-4 times over the past few days when getting out of bed at night.  He notes abdominal pain from the colitis which he notes is a little bit better.  Diarrhea is also a little bit better.  The stool is dark brown in color and he has not seen any blood recently.  In the emergency department, he developed fever, hypotension.  He was admitted to stepdown unit, PCCM consulted as well as GI.  He was started on broad-spectrum antibiotics, IV Solu-Medrol, given blood transfusion as well as pressors.  New events last 24 hours / Subjective: Overnight events reviewed; due to bradycardia, patient's pressor was switched from neo to levophed. He underwent echocardiogram and PICC line placement. Patient feeling weak overall.   Assessment & Plan:   Principal Problem:   Shock circulatory ([place]) Active Problems:   GI bleed   Sinus bradycardia   Electrolyte imbalance   Malnutrition of moderate degree  Hypovolemic and septic shock -Given 2 units packed red blood cells, IV fluids -Evidence of GI bleeding, positive fecal occult, hemoglobin 6.2 on admission, received packed red blood cells with improvement in hemoglobin -Fever upon presentation, procalcitonin 1.33.  WBC increased today 11.9.  Blood and urine culture negative to date.  Chest x-ray  unremarkable. Abdominal x-ray revealed nonobstructive gas pattern, possible bowel wall thickening. On vanco, Zosyn. Lactic acid 1.7.  -Remains on pressors, levophed   GI bleed, history of ulcerative colitis -GI following -Continue azathioprine, IV Solu cortef   Syncope -Likely in setting of orthostatic due to anemia, hypotension, sepsis  Hyponatremia -IVF    DVT prophylaxis: SCD Code Status: Full Family Communication: No family at bedside Disposition Plan: Pending clinical improvement, remain in stepdown for pressors    Consultants:   PCCM   GI   Procedures:   None   Antimicrobials:  Anti-infectives (From admission, onward)   Start     Dose/Rate Route Frequency Ordered Stop   04/18/18 0500  vancomycin (VANCOCIN) IVPB 750 mg/150 ml premix     750 mg 150 mL/hr over 60 Minutes Intravenous Every 12 hours 04/17/18 1621     04/17/18 2200  piperacillin-tazobactam (ZOSYN) IVPB 3.375 g     3.375 g 12.5 mL/hr over 240 Minutes Intravenous Every 8 hours 04/17/18 1620     04/17/18 1630  vancomycin (VANCOCIN) 1,250 mg in sodium chloride 0.9 % 250 mL IVPB     1,250 mg 166.7 mL/hr over 90 Minutes Intravenous  Once 04/17/18 1620 04/17/18 1911   04/17/18 1630  piperacillin-tazobactam (ZOSYN) IVPB 3.375 g     3.375 g 100 mL/hr over 30 Minutes Intravenous  Once 04/17/18 1620 04/17/18 1714       Objective: Vitals:   04/19/18 1100 04/19/18 1115 04/19/18 1130 04/19/18 1145  BP: 101/74 104/70 99/62 108/80  Pulse: (!) 43 (!) 55 (!) 40 (!) 53  Resp: (!) 0 11 (!) 5 (!) 7  Temp:      TempSrc:      SpO2: 100% 100% 100% 98%  Weight:      Height:        Intake/Output Summary (Last 24 hours) at 04/19/2018 1331 Last data filed at 04/19/2018 1200 Gross per 24 hour  Intake 8621.89 ml  Output 1250 ml  Net 7371.89 ml   Filed Weights   04/17/18 1252 04/17/18 1715 04/18/18 0500  Weight: 65.8 kg (145 lb) 55.8 kg (123 lb 0.3 oz) 61.5 kg (135 lb 9.3 oz)   Examination: General exam:  Appears calm and comfortable, pale appearing  Respiratory system: Clear to auscultation. Respiratory effort normal. Cardiovascular system: S1 & S2 heard, bradycardic, regular rhythm. No JVD, murmurs, rubs, gallops or clicks. No pedal edema. Gastrointestinal system: Abdomen is nondistended, soft and nontender. No organomegaly or masses felt. Normal bowel sounds heard. Central nervous system: Alert and oriented. No focal neurological deficits. Extremities: Symmetric 5 x 5 power. Skin: No rashes, lesions or ulcers Psychiatry: Judgement and insight appear normal. Mood & affect appropriate.   Data Reviewed: I have personally reviewed following labs and imaging studies  CBC: Recent Labs  Lab 04/17/18 1249 04/17/18 1815 04/18/18 0335 04/19/18 0218  WBC 5.6  --  7.7 11.9*  NEUTROABS  --   --  7.3 11.0*  HGB 6.2* 6.7* 8.9* 8.9*  HCT 19.2* 19.9* 26.7* 26.6*  MCV 85.3  --  84.5 84.7  PLT 361  --  355 196   Basic Metabolic Panel: Recent Labs  Lab 04/17/18 1249 04/17/18 1838 04/18/18 0335 04/19/18 0218  NA 124*  --  131* 130*  K 4.0  --  3.6 4.0  CL 89*  --  97* 97*  CO2 26  --  24 23  GLUCOSE 146*  --  153* 125*  BUN 13  --  9 10  CREATININE 0.90  --  0.83 0.90  CALCIUM 7.5*  --  7.6* 7.9*  MG  --  1.7 2.0 1.8  PHOS  --   --   --  3.5   GFR: Estimated Creatinine Clearance: 76.9 mL/min (by C-G formula based on SCr of 0.9 mg/dL). Liver Function Tests: Recent Labs  Lab 04/17/18 1249 04/18/18 0335 04/19/18 0218  AST 15 17 16   ALT 12 12 12   ALKPHOS 106 93 90  BILITOT 0.6 0.5 0.3  PROT 5.5* 5.2* 5.0*  ALBUMIN 1.7* 1.5* 1.3*   No results for input(s): LIPASE, AMYLASE in the last 168 hours. No results for input(s): AMMONIA in the last 168 hours. Coagulation Profile: No results for input(s): INR, PROTIME in the last 168 hours. Cardiac Enzymes: Recent Labs  Lab 04/19/18 0830  TROPONINI <0.03   BNP (last 3 results) No results for input(s): PROBNP in the last 8760  hours. HbA1C: No results for input(s): HGBA1C in the last 72 hours. CBG: No results for input(s): GLUCAP in the last 168 hours. Lipid Profile: No results for input(s): CHOL, HDL, LDLCALC, TRIG, CHOLHDL, LDLDIRECT in the last 72 hours. Thyroid Function Tests: No results for input(s): TSH, T4TOTAL, FREET4, T3FREE, THYROIDAB in the last 72 hours. Anemia Panel: No results for input(s): VITAMINB12, FOLATE, FERRITIN, TIBC, IRON, RETICCTPCT in the last 72 hours. Sepsis Labs: Recent Labs  Lab 04/17/18 1815 04/17/18 1838 04/17/18 2023 04/18/18 0335 04/19/18 0218 04/19/18 0830  PROCALCITON  --  1.33  --  1.01 0.76  --  LATICACIDVEN 1.3  --  1.7  --   --  1.7    Recent Results (from the past 240 hour(s))  Culture, blood (routine x 2)     Status: None (Preliminary result)   Collection Time: 04/17/18  4:04 PM  Result Value Ref Range Status   Specimen Description   Final    BLOOD RIGHT ANTECUBITAL Performed at [facility] [address] [place], [place] [address]    Special Requests   Final    BOTTLES DRAWN AEROBIC AND ANAEROBIC Blood Culture adequate volume Performed at [facility] [address] [place], [place] [address]    Culture   Final    NO GROWTH < 24 HOURS Performed at [facility] [address], [place], [place] [address]    Report Status PENDING  Incomplete  Culture, blood (routine x 2)     Status: None (Preliminary result)   Collection Time: 04/17/18  4:30 PM  Result Value Ref Range Status   Specimen Description   Final    BLOOD RIGHT ANTECUBITAL Performed at [facility] [address] [place], [place] [address]    Special Requests   Final    BOTTLES DRAWN AEROBIC AND ANAEROBIC Blood Culture adequate volume Performed at [facility] [address], [place], [place] [address]    Culture   Final    NO GROWTH < 24 HOURS Performed at [facility] [address],  [place], [place] [address]    Report Status PENDING  Incomplete  Culture, Urine     Status: None   Collection Time: 04/17/18  4:31 PM  Result Value Ref Range Status   Specimen Description   Final    URINE, RANDOM Performed at [facility] [address] [place], [place] [address]    Special Requests   Final    NONE Performed at [facility], [place] [address], [place], [place] [address]    Culture   Final    NO GROWTH Performed at [facility], [place] [address] [place], [place] [address]    Report Status 04/18/2018 FINAL  Final  MRSA PCR Screening     Status: None   Collection Time: 04/17/18  6:02 PM  Result Value Ref Range Status   MRSA by PCR NEGATIVE NEGATIVE Final    Comment:        The GeneXpert MRSA Assay (FDA approved for NASAL specimens only), is one component of a comprehensive MRSA colonization surveillance program. It is not intended to diagnose MRSA infection nor to guide or monitor treatment for MRSA infections. Performed at [facility], [place] [address] [place],  [address]        Radiology Studies: Dg Abd 1 View  Result Date: 04/17/2018 CLINICAL DATA:  Hypotension fever with ulcerative colitis EXAM: ABDOMEN - 1 VIEW COMPARISON:  None. FINDINGS: Air-filled but nondistended bowel in the central abdomen. Slightly featureless appearance of right abdominal loops. Phleboliths in the pelvis. IMPRESSION: Nonobstructed gas pattern. Slightly featureless appearance of bowel within the central and right abdomen, possible bowel wall thickening. Electronically Signed   By: Donavan Foil M.D.   On: 04/17/2018 18:28   Dg Chest Port 1 View  Result Date: 04/17/2018 CLINICAL DATA:  Shortness of breath.  Lightheaded. EXAM: PORTABLE CHEST 1 VIEW COMPARISON:  03/30/2018. FINDINGS: Normal sized heart. Clear lungs with normal vascularity. Diffuse osteopenia and lower thoracic spine degenerative changes.  IMPRESSION: No  acute abnormality. Electronically Signed   By: Claudie Revering M.D.   On: 04/17/2018 16:26   [place] Ekg Site Rite  Result Date: 04/19/2018 If Site Rite image not attached, placement could not be confirmed due to current cardiac rhythm.     Scheduled Meds: . sodium chloride   Intravenous Once  . azaTHIOprine  75 mg Oral Daily  . feeding supplement  1 Container Oral TID BM  . hydrocortisone sod succinate (SOLU-CORTEF) inj  100 mg Intravenous Q8H  . saccharomyces boulardii  250 mg Oral BID   Continuous Infusions: . sodium chloride    . sodium chloride Stopped (04/19/18 0644)  . lactated ringers 125 mL/hr at 04/19/18 1200  . norepinephrine (LEVOPHED) Adult infusion 5 mcg/min (04/19/18 0830)  . piperacillin-tazobactam (ZOSYN)  IV Stopped (04/19/18 1044)  . vancomycin Stopped (04/19/18 8032)     LOS: 2 days    Time spent: 25 minutes   Dessa Phi, DO [facility] [email or web address] Password [email or web address] 04/19/2018, 1:31 PM

## 2018-04-19 NOTE — Assessment & Plan Note (Addendum)
Appears to have overnight sinus bradycardia - likely physiologic given atheletic activities but with pressor need RAI a possibility given non-toxic appearance and lack of bleeding  Plan Stat troponin Stat ECHO Stat CBC Stat cortisol (noted on solumedrol/pred since July 2019)  -  - will change to hydrocort Optimize lytes - replace mag 04/19/2018 Depending on above, call cards

## 2018-04-19 NOTE — Progress Notes (Signed)
Initial Nutrition Assessment  DOCUMENTATION CODES:   Non-severe (moderate) malnutrition in context of chronic illness  INTERVENTION:   -Continue Boost Breeze po TID, each supplement provides 250 kcal and 9 grams of protein -Discussed food options on GI soft diet  NUTRITION DIAGNOSIS:   Moderate Malnutrition related to chronic illness, diarrhea(ulcerative colitis) as evidenced by percent weight loss, mild fat depletion, mild muscle depletion.  GOAL:   Patient will meet greater than or equal to 90% of their needs  MONITOR:   PO intake, Supplement acceptance, Labs, Weight trends, I & O's  REASON FOR ASSESSMENT:   Malnutrition Screening Tool    ASSESSMENT:   57 y.o. male with medical history significant of recently diagnosed ulcerative colitis presenting with SOB, lightheadedness, fainting spells over the past month.    Patient reports having questions regarding soft diet. Pt wants to follow a low fiber, low fat diet in order to help his diarrhea. Pt hesitant to try dairy at this time but states he can tolerate yogurt well. Reviewed options on the GI soft diet and answered patient's questions. Pt states he would like to continue the Boost Breeze supplements but states he has to cut it with water in order to lessen it's sweet taste. Reviewed protein options that are dairy free that he can consume at home.  PO intake 75% this morning. States his appetite is improved. Main complaint is weakness.   Pt has had chronic diarrhea d/t UC for 6 weeks PTA. UBW is 160 lb per patient. Pt has lost 25 lb since 6/10 (15% wt loss x 2 months, significant for time frame).   Medications: Florastor BID, IV Lactated Ringers Labs reviewed: Low Na Mg/Phos WNL   NUTRITION - FOCUSED PHYSICAL EXAM:    Most Recent Value  Orbital Region  Mild depletion  Upper Arm Region  Mild depletion  Thoracic and Lumbar Region  Unable to assess  Buccal Region  Mild depletion  Temple Region  Mild depletion   Clavicle Bone Region  Moderate depletion  Clavicle and Acromion Bone Region  Mild depletion  Scapular Bone Region  Unable to assess  Dorsal Hand  No depletion  Patellar Region  No depletion  Anterior Thigh Region  No depletion  Posterior Calf Region  No depletion  Edema (RD Assessment)  None       Diet Order:   Diet Order           DIET SOFT Room service appropriate? Yes; Fluid consistency: Thin  Diet effective now          EDUCATION NEEDS:   Education needs have been addressed  Skin:  Skin Assessment: Reviewed RN Assessment  Last BM:  8/4  Height:   Ht Readings from Last 1 Encounters:  04/17/18 5' 8"  (1.727 m)    Weight:   Wt Readings from Last 1 Encounters:  04/18/18 135 lb 9.3 oz (61.5 kg)    Ideal Body Weight:  70 kg  BMI:  Body mass index is 20.62 kg/m.  Estimated Nutritional Needs:   Kcal:  1600-1800  Protein:  80-90g  Fluid:  2L/day   Clayton Bibles, MS, RD, LDN [facility] Pager: [phone] After Hours Pager: [phone]

## 2018-04-19 NOTE — Progress Notes (Signed)
Peripherally Inserted Central Catheter/Midline Placement  The IV Nurse has discussed with the patient and/or persons authorized to consent for the patient, the purpose of this procedure and the potential benefits and risks involved with this procedure.  The benefits include less needle sticks, lab draws from the catheter, and the patient may be discharged home with the catheter. Risks include, but not limited to, infection, bleeding, blood clot (thrombus formation), and puncture of an artery; nerve damage and irregular heartbeat and possibility to perform a PICC exchange if needed/ordered by physician.  Alternatives to this procedure were also discussed.  Bard Power PICC patient education guide, fact sheet on infection prevention and patient information card has been provided to patient /or left at bedside.    PICC/Midline Placement Documentation  PICC Double Lumen 99/37/16 PICC Right Basilic 36 cm 0 cm (Active)  Indication for Insertion or Continuance of Line Vasoactive infusions 04/19/2018  3:59 PM  Exposed Catheter (cm) 0 cm 04/19/2018  3:59 PM  Site Assessment Clean;Dry;Intact 04/19/2018  3:59 PM  Lumen #1 Status Flushed;Saline locked;Blood return noted 04/19/2018  3:59 PM  Lumen #2 Status Flushed;Saline locked;Blood return noted 04/19/2018  3:59 PM  Dressing Type Transparent 04/19/2018  3:59 PM  Dressing Status Clean;Dry;Intact;Antimicrobial disc in place 04/19/2018  3:59 PM  Line Care Connections checked and tightened 04/19/2018  3:59 PM  Line Adjustment (NICU/IV Team Only) No 04/19/2018  3:59 PM  Dressing Intervention New dressing 04/19/2018  3:59 PM  Dressing Change Due 04/26/18 04/19/2018  3:59 PM       Rolena Infante 04/19/2018, 4:00 PM

## 2018-04-19 NOTE — Progress Notes (Signed)
eLink Physician-Brief Progress Note Patient Name: Zachary Horn DOB: Jul 17, 1961 MRN: [id number]   Date of Service  04/19/2018  HPI/Events of Note  Hypotension - Requiring increased Phenylephrine IV infusion dose with subsequent decrease in HR to 45. BP now = 117/73. No CVL or CVP.  eICU Interventions  Will order: 1. CBC STAT. 2. Bolus with 0.9 NaCl 1 liter IV now. 3. Attempt to wean Phenylephrine IV infusion as tolerated.  4. Norepinephrine IV infusion. Titrate to MAP > 65. Only if absolutely necessary. Patient does not have CVL.     Intervention Category Major Interventions: Arrhythmia - evaluation and management;Hypotension - evaluation and management  [name],[name] 04/19/2018, 2:10 AM

## 2018-04-19 NOTE — Progress Notes (Signed)
  Still with sinus brady HR 42 and levopphed 5 Cortisol 7.2 ECHO ef 50% but otherwise normal Gi not about stopping zosyn noted - but he came in septic thought PCT profile more c/w localized process. It is improving  Recent Labs  Lab 04/17/18 1838 04/18/18 0335 04/19/18 0218  PROCALCITON 1.33 1.01 0.76     Plan  continue hydrocrot Cont zosyn 04/19/2018 - can aim to stop 04/20/18    Dr. Brand Males, M.D., [facility].C.P Pulmonary and Critical Care Medicine Staff Physician, [place] Director - Interstitial Lung Disease  Program  Pulmonary [place] at [place], [place], [address]  Pager: [phone], If no answer or between  15:00h - 7:00h: call [phone] Telephone: [phone]

## 2018-04-19 NOTE — Progress Notes (Signed)
  Echocardiogram 2D Echocardiogram has been performed.  Donne Robillard T Greene Diodato 04/19/2018, 9:21 AM

## 2018-04-19 NOTE — Assessment & Plan Note (Signed)
Mag is  < 2gm%  Plan Replete mag

## 2018-04-20 DIAGNOSIS — R571 Hypovolemic shock: Secondary | ICD-10-CM

## 2018-04-20 DIAGNOSIS — R6510 Systemic inflammatory response syndrome (SIRS) of non-infectious origin without acute organ dysfunction: Secondary | ICD-10-CM

## 2018-04-20 DIAGNOSIS — K51018 Ulcerative (chronic) pancolitis with other complication: Secondary | ICD-10-CM

## 2018-04-20 DIAGNOSIS — R578 Other shock: Secondary | ICD-10-CM

## 2018-04-20 LAB — BPAM RBC
BLOOD PRODUCT EXPIRATION DATE: 201909072359
Blood Product Expiration Date: 201909072359
ISSUE DATE / TIME: 201908022159
ISSUE DATE / TIME: 201908021403
UNIT TYPE AND RH: 5100
Unit Type and Rh: 5100

## 2018-04-20 LAB — TYPE AND SCREEN
ABO/RH(D): O POS
ANTIBODY SCREEN: NEGATIVE
Unit division: 0
Unit division: 0

## 2018-04-20 LAB — CBC WITH DIFFERENTIAL/PLATELET
BASOS ABS: 0 10*3/uL (ref 0.0–0.1)
Basophils Relative: 0 %
Eosinophils Absolute: 0 10*3/uL (ref 0.0–0.7)
Eosinophils Relative: 0 %
HEMATOCRIT: 24.4 % — AB (ref 39.0–52.0)
Hemoglobin: 8.2 g/dL — ABNORMAL LOW (ref 13.0–17.0)
LYMPHS ABS: 0.5 10*3/uL — AB (ref 0.7–4.0)
LYMPHS PCT: 3 %
MCH: 28.3 pg (ref 26.0–34.0)
MCHC: 33.6 g/dL (ref 30.0–36.0)
MCV: 84.1 fL (ref 78.0–100.0)
MONO ABS: 0.3 10*3/uL (ref 0.1–1.0)
Monocytes Relative: 2 %
NEUTROS ABS: 15.4 10*3/uL — AB (ref 1.7–7.7)
Neutrophils Relative %: 95 %
Platelets: 296 10*3/uL (ref 150–400)
RBC: 2.9 MIL/uL — ABNORMAL LOW (ref 4.22–5.81)
RDW: 14.3 % (ref 11.5–15.5)
WBC: 16.2 10*3/uL — ABNORMAL HIGH (ref 4.0–10.5)

## 2018-04-20 LAB — BASIC METABOLIC PANEL
ANION GAP: 6 (ref 5–15)
BUN: 12 mg/dL (ref 6–20)
CO2: 24 mmol/L (ref 22–32)
Calcium: 7.5 mg/dL — ABNORMAL LOW (ref 8.9–10.3)
Chloride: 100 mmol/L (ref 98–111)
Creatinine, Ser: 0.84 mg/dL (ref 0.61–1.24)
GFR calc Af Amer: >60 mL/min (ref >60)
GFR calc non Af Amer: >60 mL/min (ref >60)
GLUCOSE: 121 mg/dL — AB (ref 70–99)
POTASSIUM: 3.2 mmol/L — AB (ref 3.5–5.1)
Sodium: 130 mmol/L — ABNORMAL LOW (ref 135–145)

## 2018-04-20 LAB — TROPONIN I

## 2018-04-20 LAB — PHOSPHORUS: PHOSPHORUS: 2.5 mg/dL (ref 2.5–4.6)

## 2018-04-20 LAB — MAGNESIUM: Magnesium: 2 mg/dL (ref 1.7–2.4)

## 2018-04-20 MED ORDER — POTASSIUM CHLORIDE CRYS ER 20 MEQ PO TBCR
40.0000 meq | EXTENDED_RELEASE_TABLET | Freq: Once | ORAL | Status: AC
  Administered 2018-04-20: 40 meq via ORAL
  Filled 2018-04-20: qty 2

## 2018-04-20 NOTE — Progress Notes (Signed)
PULMONARY / CRITICAL CARE MEDICINE   Name: Zachary Horn MRN: [id number] DOB: 1961-08-10 PCP Merrilee Seashore, MD LOS 3 as of 04/20/2018     ADMISSION DATE:  04/17/2018 CONSULTATION DATE:  04/17/2018   REFERRING MD:  [facility] Marcelline Deist  CHIEF COMPLAINT:  Circulatory shock  BRIEF DESCRIPTION: 57 yo male presented to ED with dizziness, fever 101F, bloody diarrhea, and hypotension from ulcerative colitis.  Recently dx with ulcerative colitis, and had delay in starting immunosuppressive tx due to Candidal esophagitis.  He was started on steroids, azathioprine and entyvio prior to admission.  SUBJECTIVE: Remains on pressors.  Slept better last night.  Stool becoming more formed.  Denies dizziness, chest pain, dyspnea, abdominal pain.  No fever.  VITAL SIGNS: BP (!) 81/54   Pulse (!) 54   Temp 98.1 F (36.7 C) (Oral)   Resp (!) 0   Ht 5' 8"  (1.727 m)   Wt 157 lb 6.5 oz (71.4 kg)   SpO2 96%   BMI 23.93 kg/m   INTAKE / OUTPUT: I/O last 3 completed shifts: In: 9474.9 [P.O.:720; I.V.:5073.5; IV Piggyback:3681.5] Out: 1580 [Urine:1580]  PHYSICAL EXAM:  General - alert Eyes - pupils reactive ENT - no sinus tenderness, no stridor Cardiac - regular rate/rhythm, no murmur Chest - equal breath sounds b/l, no wheezing or rales Abdomen - soft, non tender, + bowel sounds Extremities - no cyanosis, clubbing, or edema Skin - no rashes Lymphatics - no lymphadenopathy Neuro - CN intact, normal strength, moves extremities, follows commands Psych - normal mood and behavior   LABS CBC Recent Labs    04/18/18 0335 04/19/18 0218 04/20/18 0450  WBC 7.7 11.9* 16.2*  HGB 8.9* 8.9* 8.2*  HCT 26.7* 26.6* 24.4*  PLT 355 323 296    Coag's No results for input(s): APTT, INR in the last 72 hours.  BMET Recent Labs    04/18/18 0335 04/19/18 0218 04/20/18 0450  NA 131* 130* 130*  K 3.6 4.0 3.2*  CL 97* 97* 100  CO2 24 23 24   BUN 9 10 12   CREATININE 0.83 0.90 0.84  GLUCOSE 153*  125* 121*    Electrolytes Recent Labs    04/18/18 0335 04/19/18 0218 04/20/18 0450  CALCIUM 7.6* 7.9* 7.5*  MG 2.0 1.8 2.0  PHOS  --  3.5 2.5    Sepsis Markers Recent Labs    04/17/18 1838 04/18/18 0335 04/19/18 0218  PROCALCITON 1.33 1.01 0.76    ABG No results for input(s): PHART, PCO2ART, PO2ART in the last 72 hours.  Liver Enzymes Recent Labs    04/17/18 1249 04/18/18 0335 04/19/18 0218  AST 15 17 16   ALT 12 12 12   ALKPHOS 106 93 90  BILITOT 0.6 0.5 0.3  ALBUMIN 1.7* 1.5* 1.3*    Cardiac Enzymes Recent Labs    04/19/18 0830 04/20/18 0450  TROPONINI <0.03 <0.03    Glucose No results for input(s): GLUCAP in the last 72 hours.  Imaging [place] Ekg Site Rite  Result Date: 04/19/2018 If Site Rite image not attached, placement could not be confirmed due to current cardiac rhythm.    STUDIES: Echo 8/04 >> EF 55 to 60%, mild MR, mod PR Cortisol 8/04 >> 7.5  CULTURES: Urine 8/02 >> negative Blood 8/02 >>  ANTIBIOTICS: Vancomycin 8/02 >> 8/05 Zosyn 8/02 >> 8/05  LINES/TUBES: Rt PICC 8/04 >>   EVENTS: 8/02 Admit, GI consulted, transfuse PRBC, start high dose solumedrol 8/04 start pressors  DISCUSSION: 57 yo with hypovolemic/hemorrhagic shock from ulcerative  colitis with bloody diarrhea.  Has SIRS criteria, but no clear source of infection.  Clinically improving 8/05.  ASSESSMENT and PLAN  Hypovolemic/hemorrhagic shock in setting of bloody diarrhea. - continue IV fluids - wean pressors to keep MAP > 65 - f/u CBC and transfuse for Hb < 7 of bleeding  SIRS in setting of ulcerative colitis. - separate infectious process seems less likely - will d/c ABx - continue immunosuppressive therapy per GI  Relative adrenal insufficiency. - continue steroids  Hypokalemia. - replace as needed  DVT prophylaxis - SCDs SUP - not indicated Nutrition - soft diet Goals of care - full code  Chesley Mires, MD [facility] 04/20/2018,  11:30 AM

## 2018-04-20 NOTE — Progress Notes (Signed)
[facility] Gastroenterology Progress Note  STORMY SABOL 57 y.o. [birth year]   Subjective: Reports decrease in diarrhea and no blood reported on this morning's small watery stool. Feels better. Sitting up in bed eating solid food. Brother in room.  Objective: Vital signs: Vitals:   04/20/18 1400 04/20/18 1415  BP: 90/69 (!) 87/63  Pulse:    Resp: (!) 9 16  Temp:    SpO2:    P 73, T 97.8, SpO2 100%  Physical Exam: Gen: lethargic, thin, no acute distress, pleasant HEENT: anicteric sclera CV: RRR Chest: CTA B Abd: soft, nontender, nondistended, +BS Ext: no edema  Lab Results: Recent Labs    04/19/18 0218 04/20/18 0450  NA 130* 130*  K 4.0 3.2*  CL 97* 100  CO2 23 24  GLUCOSE 125* 121*  BUN 10 12  CREATININE 0.90 0.84  CALCIUM 7.9* 7.5*  MG 1.8 2.0  PHOS 3.5 2.5   Recent Labs    04/18/18 0335 04/19/18 0218  AST 17 16  ALT 12 12  ALKPHOS 93 90  BILITOT 0.5 0.3  PROT 5.2* 5.0*  ALBUMIN 1.5* 1.3*   Recent Labs    04/19/18 0218 04/20/18 0450  WBC 11.9* 16.2*  NEUTROABS 11.0* 15.4*  HGB 8.9* 8.2*  HCT 26.6* 24.4*  MCV 84.7 84.1  PLT 323 296      Assessment/Plan: Ulcerative pancolitis recently started on Vedolizumab Weyman Rodney) prior to admit and showing signs of improvement on IV steroids. Weaned off of pressors this morning. Supportive care. Will follow.   [place] C. 04/20/2018, 2:47 PM  Questions please call [phone] ID: [name], male   DOB: 1961-03-21, 57 y.o.   MRN: [id number]

## 2018-04-21 DIAGNOSIS — E861 Hypovolemia: Secondary | ICD-10-CM

## 2018-04-21 DIAGNOSIS — I9589 Other hypotension: Secondary | ICD-10-CM

## 2018-04-21 LAB — CBC
HCT: 24.7 % — ABNORMAL LOW (ref 39.0–52.0)
Hemoglobin: 8.2 g/dL — ABNORMAL LOW (ref 13.0–17.0)
MCH: 28.1 pg (ref 26.0–34.0)
MCHC: 33.2 g/dL (ref 30.0–36.0)
MCV: 84.6 fL (ref 78.0–100.0)
Platelets: 233 10*3/uL (ref 150–400)
RBC: 2.92 MIL/uL — AB (ref 4.22–5.81)
RDW: 14.3 % (ref 11.5–15.5)
WBC: 11.4 10*3/uL — ABNORMAL HIGH (ref 4.0–10.5)

## 2018-04-21 LAB — BASIC METABOLIC PANEL
Anion gap: 6 (ref 5–15)
BUN: 12 mg/dL (ref 6–20)
CALCIUM: 7.6 mg/dL — AB (ref 8.9–10.3)
CO2: 26 mmol/L (ref 22–32)
CREATININE: 0.77 mg/dL (ref 0.61–1.24)
Chloride: 100 mmol/L (ref 98–111)
GFR calc Af Amer: >60 mL/min (ref >60)
GFR calc non Af Amer: >60 mL/min (ref >60)
GLUCOSE: 97 mg/dL (ref 70–99)
Potassium: 3.2 mmol/L — ABNORMAL LOW (ref 3.5–5.1)
Sodium: 132 mmol/L — ABNORMAL LOW (ref 135–145)

## 2018-04-21 LAB — MAGNESIUM: Magnesium: 1.9 mg/dL (ref 1.7–2.4)

## 2018-04-21 LAB — PHOSPHORUS: Phosphorus: 2.4 mg/dL — ABNORMAL LOW (ref 2.5–4.6)

## 2018-04-21 MED ORDER — POTASSIUM CHLORIDE CRYS ER 20 MEQ PO TBCR
40.0000 meq | EXTENDED_RELEASE_TABLET | Freq: Once | ORAL | Status: AC
  Administered 2018-04-21: 40 meq via ORAL
  Filled 2018-04-21: qty 2

## 2018-04-21 MED ORDER — ALBUMIN HUMAN 5 % IV SOLN
12.5000 g | Freq: Once | INTRAVENOUS | Status: AC
  Administered 2018-04-21: 12.5 g via INTRAVENOUS
  Filled 2018-04-21: qty 250

## 2018-04-21 NOTE — Progress Notes (Signed)
[facility] Progress Note Patient Name: Zachary Horn DOB: October 13, 1960 MRN: [id number]   Date of Service  04/21/2018  HPI/Events of Note  hypokalemai  eICU Interventions  40 meq po     Intervention Category Minor Interventions: Electrolytes abnormality - evaluation and management  Sharia Reeve 04/21/2018, 5:11 AM

## 2018-04-21 NOTE — Progress Notes (Signed)
PULMONARY / CRITICAL CARE MEDICINE   Name: DAMONTAE LOPPNOW MRN: [id number] DOB: [birth year] PCP Merrilee Seashore, MD LOS 4 as of 04/21/2018     ADMISSION DATE:  04/17/2018 CONSULTATION DATE:  04/17/2018   REFERRING MD:  Triad Dr Marcelline Deist  CHIEF COMPLAINT:  Circulatory shock  BRIEF DESCRIPTION: 57 yo male presented to ED with dizziness, fever 101F, bloody diarrhea, and hypotension from ulcerative colitis.  Recently dx with ulcerative colitis, and had delay in starting immunosuppressive tx due to Candidal esophagitis.  He was started on steroids, azathioprine and entyvio prior to admission.  SUBJECTIVE: On very, very low dose pressors still.  Got nauseated after eating greasy food.  Denies abdominal pain.  Stool more formed.  VITAL SIGNS: BP 111/70   Pulse (!) 51   Temp 98.1 F (36.7 C) (Oral)   Resp (!) 0   Ht 5' 8"  (1.727 m)   Wt 157 lb 6.5 oz (71.4 kg)   SpO2 96%   BMI 23.93 kg/m   INTAKE / OUTPUT: I/O last 3 completed shifts: In: 5292.6 [I.V.:5042.9; IV Piggyback:249.7] Out: 1880 [Urine:1880]  PHYSICAL EXAM:  General - alert Eyes - pupils reactive ENT - no sinus tenderness, no stridor Cardiac - regular rate/rhythm, no murmur Chest - equal breath sounds b/l, no wheezing or rales Abdomen - soft, non tender, + bowel sounds Extremities - no cyanosis, clubbing, or edema Skin - no rashes Lymphatics - no lymphadenopathy Neuro - CN intact, normal strength, moves extremities, follows commands Psych - normal mood and behavior   LABS CBC Recent Labs    04/19/18 0218 04/20/18 0450 04/21/18 0344  WBC 11.9* 16.2* 11.4*  HGB 8.9* 8.2* 8.2*  HCT 26.6* 24.4* 24.7*  PLT 323 296 233    Coag's No results for input(s): APTT, INR in the last 72 hours.  BMET Recent Labs    04/19/18 0218 04/20/18 0450 04/21/18 0344  NA 130* 130* 132*  K 4.0 3.2* 3.2*  CL 97* 100 100  CO2 23 24 26   BUN 10 12 12   CREATININE 0.90 0.84 0.77  GLUCOSE 125* 121* 97     Electrolytes Recent Labs    04/19/18 0218 04/20/18 0450 04/21/18 0344  CALCIUM 7.9* 7.5* 7.6*  MG 1.8 2.0 1.9  PHOS 3.5 2.5 2.4*    Sepsis Markers Recent Labs    04/19/18 0218  PROCALCITON 0.76    ABG No results for input(s): PHART, PCO2ART, PO2ART in the last 72 hours.  Liver Enzymes Recent Labs    04/19/18 0218  AST 16  ALT 12  ALKPHOS 90  BILITOT 0.3  ALBUMIN 1.3*    Cardiac Enzymes Recent Labs    04/19/18 0830 04/20/18 0450  TROPONINI <0.03 <0.03    Glucose No results for input(s): GLUCAP in the last 72 hours.  Imaging No results found.   STUDIES: Echo 8/04 >> EF 55 to 60%, mild MR, mod PR Cortisol 8/04 >> 7.5  CULTURES: Urine 8/02 >> negative Blood 8/02 >>  ANTIBIOTICS: Vancomycin 8/02 >> 8/05 Zosyn 8/02 >> 8/05  LINES/TUBES: Rt PICC 8/04 >>   EVENTS: 8/02 Admit, GI consulted, transfuse PRBC, start high dose solumedrol 8/04 start pressors 8/05 d/c ABx  DISCUSSION: 57 yo with hypovolemic/hemorrhagic shock from ulcerative colitis with bloody diarrhea.  Remains on very low dose pressors.  Still has room to give fluids.  ASSESSMENT and PLAN  Hypovolemic/hemorrhagic shock in setting of bloody diarrhea. - fluid bolus of albumin - continue IV fluids - wean pressors to keep MAP >  60 - f/u CBC and transfuse for Hb < 7  SIRS in setting of ulcerative colitis. - continue immunosuppressive therapy per GI  Relative adrenal insufficiency. - continue steroids  Hypokalemia. - replace as needed  DVT prophylaxis - SCDs SUP - not indicated Nutrition - soft diet Goals of care - full code  Chesley Mires, MD [place] 04/21/2018, 10:12 AM

## 2018-04-21 NOTE — Progress Notes (Signed)
[facility] Gastroenterology Progress Note  Zachary Horn 57 y.o. [birth year]   Subjective: Pressors restarted yesterday afternoon and weaned off this morning. Continues to have loose stool (no blood per nursing). Denies abdominal pain. Sitting up in bed eating lunch.  Objective: Vital signs: Vitals:   04/21/18 1215 04/21/18 1230  BP:  (!) 86/68  Pulse: 64 65  Resp: 16 10  Temp:    SpO2: 100% 100%  T 98.1  Physical Exam: Gen: lethargic, thin, no acute distress  HEENT: anicteric sclera CV: RRR Chest: CTA B Abd: soft, nontender, nondistended, +BS Ext: no edema  Lab Results: Recent Labs    04/20/18 0450 04/21/18 0344  NA 130* 132*  K 3.2* 3.2*  CL 100 100  CO2 24 26  GLUCOSE 121* 97  BUN 12 12  CREATININE 0.84 0.77  CALCIUM 7.5* 7.6*  MG 2.0 1.9  PHOS 2.5 2.4*   Recent Labs    04/19/18 0218  AST 16  ALT 12  ALKPHOS 90  BILITOT 0.3  PROT 5.0*  ALBUMIN 1.3*   Recent Labs    04/19/18 0218 04/20/18 0450 04/21/18 0344  WBC 11.9* 16.2* 11.4*  NEUTROABS 11.0* 15.4*  --   HGB 8.9* 8.2* 8.2*  HCT 26.6* 24.4* 24.7*  MCV 84.7 84.1 84.6  PLT 323 296 233      Assessment/Plan: Ulcerative pancolitis on Vedolizumab, Azathioprine and on IV steroids. Slowly improving. Outpt Vedolizumab next week for his 2nd induction dose. No change in GI recs. Supportive care. Will follow.   [place] C. 04/21/2018, 1:11 PM  Questions please call [phone] ID: Zachary Horn, male   DOB: 12-02-1960, 57 y.o.   MRN: [id number]

## 2018-04-22 DIAGNOSIS — K51011 Ulcerative (chronic) pancolitis with rectal bleeding: Principal | ICD-10-CM

## 2018-04-22 LAB — CULTURE, BLOOD (ROUTINE X 2)
CULTURE: NO GROWTH
CULTURE: NO GROWTH
SPECIAL REQUESTS: ADEQUATE
SPECIAL REQUESTS: ADEQUATE

## 2018-04-22 LAB — HEMOGLOBIN AND HEMATOCRIT, BLOOD
HEMATOCRIT: 27.8 % — AB (ref 39.0–52.0)
HEMOGLOBIN: 9.1 g/dL — AB (ref 13.0–17.0)

## 2018-04-22 LAB — BASIC METABOLIC PANEL
Anion gap: 5 (ref 5–15)
BUN: 12 mg/dL (ref 6–20)
CALCIUM: 7.5 mg/dL — AB (ref 8.9–10.3)
CO2: 27 mmol/L (ref 22–32)
Chloride: 102 mmol/L (ref 98–111)
Creatinine, Ser: 0.79 mg/dL (ref 0.61–1.24)
GFR calc Af Amer: >60 mL/min (ref >60)
GLUCOSE: 80 mg/dL (ref 70–99)
Potassium: 3.4 mmol/L — ABNORMAL LOW (ref 3.5–5.1)
Sodium: 134 mmol/L — ABNORMAL LOW (ref 135–145)

## 2018-04-22 LAB — CBC
HCT: 22.5 % — ABNORMAL LOW (ref 39.0–52.0)
Hemoglobin: 7.3 g/dL — ABNORMAL LOW (ref 13.0–17.0)
MCH: 27.9 pg (ref 26.0–34.0)
MCHC: 32.4 g/dL (ref 30.0–36.0)
MCV: 85.9 fL (ref 78.0–100.0)
Platelets: 175 10*3/uL (ref 150–400)
RBC: 2.62 MIL/uL — ABNORMAL LOW (ref 4.22–5.81)
RDW: 14.2 % (ref 11.5–15.5)
WBC: 9.6 10*3/uL (ref 4.0–10.5)

## 2018-04-22 MED ORDER — POTASSIUM CHLORIDE CRYS ER 20 MEQ PO TBCR
40.0000 meq | EXTENDED_RELEASE_TABLET | Freq: Four times a day (QID) | ORAL | Status: AC
  Administered 2018-04-22 (×2): 40 meq via ORAL
  Filled 2018-04-22 (×2): qty 2

## 2018-04-22 MED ORDER — PREDNISONE 10 MG PO TABS
10.0000 mg | ORAL_TABLET | Freq: Every day | ORAL | Status: DC
  Administered 2018-04-22 – 2018-04-23 (×2): 10 mg via ORAL
  Filled 2018-04-22 (×2): qty 1

## 2018-04-22 NOTE — Progress Notes (Signed)
PULMONARY / CRITICAL CARE MEDICINE   Name: Zachary Horn MRN: [id number] DOB: January 10, 1961 PCP Merrilee Seashore, MD LOS 5 as of 04/22/2018     ADMISSION DATE:  04/17/2018 CONSULTATION DATE:  04/17/2018   REFERRING MD:  Triad Dr Marcelline Deist  CHIEF COMPLAINT:  Circulatory shock  BRIEF DESCRIPTION: 57 yo male presented to ED with dizziness, fever 101F, bloody diarrhea, and hypotension from ulcerative colitis.  Recently dx with ulcerative colitis, and had delay in starting immunosuppressive tx due to Candidal esophagitis.  He was started on steroids, azathioprine and entyvio prior to admission.  SUBJECTIVE: Transient on pressors overnight.  Off this AM.  Had trouble tolerating some of his diet yesterday.  Denies nausea, abdominal pain.  Noted to have some blood streaking in stool this AM, but otherwise stool more formed.  VITAL SIGNS: BP (!) 95/55 (BP Location: Left Arm)   Pulse 75   Temp 98.5 F (36.9 C) (Oral)   Resp 18   Ht 5' 8"  (1.727 m)   Wt 157 lb 6.5 oz (71.4 kg)   SpO2 98%   BMI 23.93 kg/m   INTAKE / OUTPUT: I/O last 3 completed shifts: In: 5602.8 [I.V.:5352.7; IV Piggyback:250] Out: 1950 [Urine:1950]  PHYSICAL EXAM:  General - alert Eyes - pupils reactive ENT - no sinus tenderness, no stridor Cardiac - regular rate/rhythm, no murmur Chest - equal breath sounds b/l, no wheezing or rales Abdomen - soft, non tender, + bowel sounds Extremities - no cyanosis, clubbing, or edema Skin - no rashes Lymphatics - no lymphadenopathy Neuro - CN intact, normal strength, moves extremities, follows commands Psych - normal mood and behavior   LABS CBC Recent Labs    04/20/18 0450 04/21/18 0344 04/22/18 0634  WBC 16.2* 11.4* 9.6  HGB 8.2* 8.2* 7.3*  HCT 24.4* 24.7* 22.5*  PLT 296 233 175    Coag's No results for input(s): APTT, INR in the last 72 hours.  BMET Recent Labs    04/20/18 0450 04/21/18 0344 04/22/18 0634  NA 130* 132* 134*  K 3.2* 3.2* 3.4*  CL 100  100 102  CO2 24 26 27   BUN 12 12 12   CREATININE 0.84 0.77 0.79  GLUCOSE 121* 97 80    Electrolytes Recent Labs    04/20/18 0450 04/21/18 0344 04/22/18 0634  CALCIUM 7.5* 7.6* 7.5*  MG 2.0 1.9  --   PHOS 2.5 2.4*  --     Sepsis Markers No results for input(s): PROCALCITON, O2SATVEN in the last 72 hours.  Invalid input(s): LACTICACIDVEN  ABG No results for input(s): PHART, PCO2ART, PO2ART in the last 72 hours.  Liver Enzymes No results for input(s): AST, ALT, ALKPHOS, BILITOT, ALBUMIN in the last 72 hours.  Cardiac Enzymes Recent Labs    04/20/18 0450  TROPONINI <0.03    Glucose No results for input(s): GLUCAP in the last 72 hours.  Imaging No results found.   STUDIES: Echo 8/04 >> EF 55 to 60%, mild MR, mod PR Cortisol 8/04 >> 7.5  CULTURES: Urine 8/02 >> negative Blood 8/02 >>  ANTIBIOTICS: Vancomycin 8/02 >> 8/05 Zosyn 8/02 >> 8/05  LINES/TUBES: Rt PICC 8/04 >>   EVENTS: 8/02 Admit, GI consulted, transfuse PRBC, start high dose solumedrol 8/04 start pressors 8/05 d/c ABx  DISCUSSION: 57 yo with hypovolemic/hemorrhagic shock from ulcerative colitis with bloody diarrhea.  Still needing pressors intermittently, had some blood in stool this AM, and Hb lower.  Need to continue monitoring in ICU for at least one more day.  ASSESSMENT/PLAN:  Hypovolemic/hemorrhagic shock in setting of bloody diarrhea. - continue IV fluids - hopefully can keep off pressors - f/u CBC and transfuse for Hb < 7  SIRS in setting of ulcerative colitis. - continue imuran - change from solucortef to prednisone 10 mg daily - is due for entyvio next week - GI following  Hypokalemia. - replace as needed  DVT prophylaxis - SCDs SUP - not indicated Nutrition - soft diet Goals of care - full code  Chesley Mires, MD [place] 04/22/2018, 9:21 AM

## 2018-04-22 NOTE — Progress Notes (Signed)
[facility] Gastroenterology Progress Note  Zachary Horn 57 y.o. Apr 08, 1961   Subjective: Streaks of blood with BM today. Denies abdominal pain. Feels ok. Tolerating POs.  Objective: Vital signs: Vitals:   04/22/18 1500 04/22/18 1600  BP: 91/60 108/71  Pulse: 64 (!) 58  Resp: (!) 0 17  Temp:    SpO2: 92% 99%  T 98.2  Physical Exam: Gen: lethargic, no acute distress, thin, pleasant HEENT: anicteric sclera CV: RRR Chest: CTA B Abd: soft, nontender, nondistended, +BS Ext: no edema  Lab Results: Recent Labs    04/20/18 0450 04/21/18 0344 04/22/18 0634  NA 130* 132* 134*  K 3.2* 3.2* 3.4*  CL 100 100 102  CO2 24 26 27   GLUCOSE 121* 97 80  BUN 12 12 12   CREATININE 0.84 0.77 0.79  CALCIUM 7.5* 7.6* 7.5*  MG 2.0 1.9  --   PHOS 2.5 2.4*  --    No results for input(s): AST, ALT, ALKPHOS, BILITOT, PROT, ALBUMIN in the last 72 hours. Recent Labs    04/20/18 0450 04/21/18 0344 04/22/18 0634  WBC 16.2* 11.4* 9.6  NEUTROABS 15.4*  --   --   HGB 8.2* 8.2* 7.3*  HCT 24.4* 24.7* 22.5*  MCV 84.1 84.6 85.9  PLT 296 233 175      Assessment/Plan: Ulcerative pancolitis improving on steroids, Imuran and first induction dose of Entyvio last week. Hgb dropped to 7.3. Suspect streaks of blood today are due to a rectal outlet source such as hemorrhoids but his colitis will cause occasional bleeding chronically as well. Encourage PO intake as tolerated. BP improved today and ok to transfer to floor tomorrow if remains stable. Follow H/H. Supportive care. Hopefully home in next 2-3 days. Will follow.   [place] C. 04/22/2018, 4:31 PM  Questions please call [phone] ID: Zachary Horn, male   DOB: 16-Feb-1961, 57 y.o.   MRN: [id number]

## 2018-04-23 DIAGNOSIS — D62 Acute posthemorrhagic anemia: Secondary | ICD-10-CM

## 2018-04-23 LAB — GLUCOSE, CAPILLARY: Glucose-Capillary: 81 mg/dL (ref 70–99)

## 2018-04-23 LAB — BASIC METABOLIC PANEL
ANION GAP: 7 (ref 5–15)
BUN: 11 mg/dL (ref 6–20)
CALCIUM: 7.6 mg/dL — AB (ref 8.9–10.3)
CHLORIDE: 102 mmol/L (ref 98–111)
CO2: 24 mmol/L (ref 22–32)
CREATININE: 0.78 mg/dL (ref 0.61–1.24)
GFR calc non Af Amer: >60 mL/min (ref >60)
Glucose, Bld: 59 mg/dL — ABNORMAL LOW (ref 70–99)
Potassium: 3.5 mmol/L (ref 3.5–5.1)
SODIUM: 133 mmol/L — AB (ref 135–145)

## 2018-04-23 LAB — CBC
HCT: 23.3 % — ABNORMAL LOW (ref 39.0–52.0)
Hemoglobin: 7.7 g/dL — ABNORMAL LOW (ref 13.0–17.0)
MCH: 28 pg (ref 26.0–34.0)
MCHC: 33 g/dL (ref 30.0–36.0)
MCV: 84.7 fL (ref 78.0–100.0)
Platelets: 190 10*3/uL (ref 150–400)
RBC: 2.75 MIL/uL — ABNORMAL LOW (ref 4.22–5.81)
RDW: 14.6 % (ref 11.5–15.5)
WBC: 7.7 10*3/uL (ref 4.0–10.5)

## 2018-04-23 MED ORDER — ACETAMINOPHEN 325 MG PO TABS
650.0000 mg | ORAL_TABLET | Freq: Four times a day (QID) | ORAL | Status: DC | PRN
  Administered 2018-04-23: 650 mg via ORAL
  Filled 2018-04-23: qty 2

## 2018-04-23 MED ORDER — POTASSIUM CHLORIDE CRYS ER 20 MEQ PO TBCR
40.0000 meq | EXTENDED_RELEASE_TABLET | Freq: Once | ORAL | Status: AC
  Administered 2018-04-23: 40 meq via ORAL
  Filled 2018-04-23: qty 2

## 2018-04-23 MED ORDER — PREDNISONE 20 MG PO TABS
40.0000 mg | ORAL_TABLET | Freq: Every day | ORAL | Status: DC
  Administered 2018-04-24: 40 mg via ORAL
  Filled 2018-04-23: qty 2

## 2018-04-23 MED ORDER — BOOST / RESOURCE BREEZE PO LIQD CUSTOM: 1.0000 | Freq: Two times a day (BID) | ORAL | Status: DC

## 2018-04-23 MED ORDER — PRO-STAT SUGAR FREE PO LIQD
30.0000 mL | Freq: Two times a day (BID) | ORAL | Status: DC
  Administered 2018-04-23 – 2018-04-24 (×2): 30 mL via ORAL
  Filled 2018-04-23 (×2): qty 30

## 2018-04-23 MED ORDER — ADULT MULTIVITAMIN W/MINERALS CH
1.0000 | ORAL_TABLET | Freq: Every day | ORAL | Status: DC
  Administered 2018-04-23 – 2018-04-24 (×2): 1 via ORAL
  Filled 2018-04-23 (×2): qty 1

## 2018-04-23 NOTE — Progress Notes (Signed)
Nutrition Follow-up  DOCUMENTATION CODES:   Non-severe (moderate) malnutrition in context of chronic illness  INTERVENTION:  - Continue Boost Breeze but will decrease to BID. - Will order 30 mL Prostat BID, each supplement provides 100 kcal and 15 grams of protein. - Continue to encourage PO intakes. - RD will continue to monitor for additional nutrition-related needs.   NUTRITION DIAGNOSIS:   Moderate Malnutrition related to chronic illness, diarrhea(ulcerative colitis) as evidenced by percent weight loss, mild fat depletion, mild muscle depletion. -ongoing  GOAL:   Patient will meet greater than or equal to 90% of their needs -unmet  MONITOR:   PO intake, Supplement acceptance, Labs, Weight trends, I & O's  ASSESSMENT:   57 y.o. male with medical history significant of recently diagnosed ulcerative colitis presenting with SOB, lightheadedness, fainting spells over the past month.    Weight trending up and is +5.6 kg since admission. Patient has been eating mainly ~25% of meals over the past few days. Yesterday he consumed a total of 384 kcal and 18 grams of protein. This morning for breakfast he consumed 110 kcal and 5 grams of protein. Patient has been mainly consuming items such as chicken, rice, yogurt, scrambled eggs, and oatmeal. He has been accepting Boost Breeze ~50% of the time but feels they are too sweet so he dilutes them with water. He still prefers to limit milk/dairy and any milky oral nutrition supplements. Will trial Prostat.   Patient was provided education and handouts at RD assessment on 8/4 concerning Soft diet and does not have any questions or concerns at this time.  Per Zachary Horn's note this AM: Hgb trending up and patient able to walk 3 laps around the unit this AM without symptoms (no plan for transfusion), BP often dips while patient is sleeping, SIRS in setting of UC, intermittent fluid and electrolyte imbalances with hypokalemia 8/6 and 8/7,LUE swelling with  thought that this is 2/2 IV infiltration and plan to re-assess tomorrow.   Medications reviewed; 40 mEq oral KCl x1 dose today and x2 doses yesterday, 10 mg Deltasone/day, 250 mg Florastor BID.  Labs reviewed; Na: 133 mmol/, Ca: 7.6 mg/dL.      Diet Order:   Diet Order            DIET SOFT Room service appropriate? Yes; Fluid consistency: Thin  Diet effective now              EDUCATION NEEDS:   Education needs have been addressed  Skin:  Skin Assessment: Reviewed RN Assessment  Last BM:  8/8  Height:   Ht Readings from Last 1 Encounters:  04/17/18 5' 8"  (1.727 m)    Weight:   Wt Readings from Last 1 Encounters:  04/20/18 71.4 kg    Ideal Body Weight:  70 kg  BMI:  Body mass index is 23.93 kg/m.  Estimated Nutritional Needs:   Kcal:  1600-1800  Protein:  80-90g  Fluid:  2L/day     Zachary Matin, MS, RD, LDN, CNSC Inpatient Clinical Dietitian Pager # [phone] After hours/weekend pager # [phone]

## 2018-04-23 NOTE — Progress Notes (Signed)
PULMONARY / CRITICAL CARE MEDICINE   Name: Zachary Horn MRN: [id number] DOB: May 13, 1961 PCP Merrilee Seashore, MD LOS 6 as of 04/23/2018     ADMISSION DATE:  04/17/2018 CONSULTATION DATE:  04/17/2018   REFERRING MD:  Triad Dr Marcelline Deist  CHIEF COMPLAINT:  Circulatory shock  BRIEF DESCRIPTION: 57 yo male presented to ED with dizziness, fever 101F, bloody diarrhea, and hypotension from ulcerative colitis.  Recently dx with ulcerative colitis, and had delay in starting immunosuppressive tx due to Candidal esophagitis.  He was started on steroids, azathioprine and entyvio prior to admission.  SUBJECTIVE: He feels well.  He is in no distress.  Does not note any bloody stool last night.  Denies shortness of breath, denies dizziness or lightheadedness or chest pain. VITAL SIGNS: Blood Pressure 90/61 (BP Location: Right Arm)   Pulse 72   Temperature 98.5 F (36.9 C) (Oral)   Respiration 13   Height 5' 8"  (1.727 m)   Weight 71.4 kg   Oxygen Saturation 93%   Body Mass Index 23.93 kg/m   INTAKE / OUTPUT: I/O last 3 completed shifts: In: 5262.8 [P.O.:840; I.V.:4422.8] Out: 1550 [Urine:1550]  PHYSICAL EXAM:  General: This is a pleasant 57 year old white male is currently resting in bed he is in no acute distress HEENT normocephalic atraumatic no jugular venous distention Pulmonary: Clear to auscultation without accessory use Cardiac: Regular rate and rhythm Abdomen: Soft nontender no organomegaly Exline extremities: Warm and dry.  He has some dependent edema in the left upper extremity just below the antecubital joint.  This is is not warm, it feels like possibly a infiltrate from IV Neuro: Awake oriented no focal deficits   LABS CBC Recent Labs    04/21/18 0344 04/22/18 0634 04/22/18 1800 04/23/18 0406  WBC 11.4* 9.6  --  7.7  HGB 8.2* 7.3* 9.1* 7.7*  HCT 24.7* 22.5* 27.8* 23.3*  PLT 233 175  --  190    Coag's No results for input(s): APTT, INR in the last 72  hours.  BMET Recent Labs    04/21/18 0344 04/22/18 0634 04/23/18 0406  NA 132* 134* 133*  K 3.2* 3.4* 3.5  CL 100 102 102  CO2 26 27 24   BUN 12 12 11   CREATININE 0.77 0.79 0.78  GLUCOSE 97 80 59*    Electrolytes Recent Labs    04/21/18 0344 04/22/18 0634 04/23/18 0406  CALCIUM 7.6* 7.5* 7.6*  MG 1.9  --   --   PHOS 2.4*  --   --     Sepsis Markers No results for input(s): PROCALCITON, O2SATVEN in the last 72 hours.  Invalid input(s): LACTICACIDVEN  ABG No results for input(s): PHART, PCO2ART, PO2ART in the last 72 hours.  Liver Enzymes No results for input(s): AST, ALT, ALKPHOS, BILITOT, ALBUMIN in the last 72 hours.  Cardiac Enzymes No results for input(s): TROPONINI, PROBNP in the last 72 hours.  Glucose Recent Labs    04/23/18 0806  GLUCAP 81    Imaging No results found.   STUDIES: Echo 8/04 >> EF 55 to 60%, mild MR, mod PR Cortisol 8/04 >> 7.5  CULTURES: Urine 8/02 >> negative Blood 8/02 >>  ANTIBIOTICS: Vancomycin 8/02 >> 8/05 Zosyn 8/02 >> 8/05  LINES/TUBES: Rt PICC 8/04 >>   EVENTS: 8/02 Admit, GI consulted, transfuse PRBC, start high dose solumedrol 8/04 start pressors 8/05 d/c ABx  DISCUSSION: 57 yo with hypovolemic/hemorrhagic shock from ulcerative colitis with bloody diarrhea.  Still needing pressors intermittently, had some blood in  stool this AM, and Hb lower.  Need to continue monitoring in ICU for at least one more day.  ASSESSMENT/PLAN:  Hypovolemic/hemorrhagic shock in setting of bloody diarrhea. -Hemoglobin yesterday bumped from 7.3-9.1 without intervention, I think this represents an erroneous lab draw, today is more in line with prior labs at 7.7. -His blood pressure remains borderline with best reading in the low 90s, frequently systolic in the 00K.  He has been asymptomatic.  His blood pressure dips to have been while he sleeping.  He has no evidence of endorgan dysfunction. Plan KVO IV fluids We will get him up  and ambulating, check orthostatics, check symptom burdens.  We can except systolic blood pressure in the 80s, will consider transfusion if he symptomatic during ambulation  SIRS in setting of ulcerative colitis. Plan Continue the Imuran, prednisone 10 mg daily Entyvio next week Further recs per GI   Intermittent fluid and electrolyte imbalance: Hypokalemia Plan Replace and recheck  Left upper extremity arm swelling. I think this is an IV infiltrate, could be DVT but does not appear that way, given recent bleeding he would not be a candidate for anticoagulation even if it were a clot Plan Elevate extremity Encourage range of motion Warm compress Reassess in a.m.   DVT prophylaxis - SCDs SUP - not indicated Nutrition - soft diet Goals of care - full code  Erick Colace ACNP-BC [place] Pager # [phone] [place] # [phone] if no answer

## 2018-04-23 NOTE — Progress Notes (Signed)
Summary 1) Hypovolemic/hemorrhagic shock in setting of bloody diarrhea->hgb holding at 7.9 no evidence of active bleeding. Tolerated ambulation. BP 80s-90s.  2) Ulcerative Colitis  3) intermittent fluid and electrolyte  4) Left upper extremity arm swelling.     To Do 1) accept BP in 80s., mobilize, trend cbc over 24-48hrs 2) continue imurna and pred. He is scheduled for ENtyvio next week 3) f/u am chemistry  4) f/u left upper extremity swelling. May need to consider [place] eval but not candidat for [facility]   Labs Labs: WBC/Hgb/Hct/Plts:  7.7/7.7/23.3/190 (08/08 0406) BUN/Cr/glu/ALT/AST/amyl/lip:  11/0.78/--/--/--/--/-- (08/08 0406)   Na/K/Cl/CO2:  133/3.5/102/24 (08/08 0406) Recent Results (from the past 720 hour(s))  Culture, blood (routine x 2)     Status: None   Collection Time: 04/17/18  4:04 PM  Result Value Ref Range Status   Specimen Description   Final    BLOOD RIGHT ANTECUBITAL Performed at [facility] [address] [place], [place] [address]    Special Requests   Final    BOTTLES DRAWN AEROBIC AND ANAEROBIC Blood Culture adequate volume Performed at [facility] [address], [place], [place] [address]    Culture   Final    NO GROWTH 5 DAYS Performed at [facility], [place] [address] [place], [place] [address]    Report Status 04/22/2018 FINAL  Final  Culture, blood (routine x 2)     Status: None   Collection Time: 04/17/18  4:30 PM  Result Value Ref Range Status   Specimen Description   Final    BLOOD RIGHT ANTECUBITAL Performed at [facility] [address], [place], [place] [address]    Special Requests   Final    BOTTLES DRAWN AEROBIC AND ANAEROBIC Blood Culture adequate volume Performed at [facility] [address], [place], [place] [address]    Culture   Final    NO GROWTH 5 DAYS Performed at [facility], [place] [address], [place], [place] [address]    Report  Status 04/22/2018 FINAL  Final  Culture, Urine     Status: None   Collection Time: 04/17/18  4:31 PM  Result Value Ref Range Status   Specimen Description   Final    URINE, RANDOM Performed at [facility] [address] [place], [place] [address]    Special Requests   Final    NONE Performed at [facility], [place] [address] [place], [place] [address]    Culture   Final    NO GROWTH Performed at [facility], [place] [address] [place], [place] [address]    Report Status 04/18/2018 FINAL  Final  MRSA PCR Screening     Status: None   Collection Time: 04/17/18  6:02 PM  Result Value Ref Range Status   MRSA by PCR NEGATIVE NEGATIVE Final    Comment:        The GeneXpert MRSA Assay (FDA approved for NASAL specimens only), is one component of a comprehensive MRSA colonization surveillance program. It is not intended to diagnose MRSA infection nor to guide or monitor treatment for MRSA infections. Performed at [facility], [place] [address], [place],  [address]         Medications Medications: . azaTHIOprine  75 mg Oral Daily  . Chlorhexidine Gluconate Cloth  6 each Topical Daily  . feeding supplement  1 Container Oral BID BM  . feeding supplement (  PRO-STAT SUGAR FREE 64)  30 mL Oral BID  . multivitamin with minerals  1 tablet Oral Daily  . potassium chloride  40 mEq Oral Once  . predniSONE  10 mg Oral Q breakfast  . saccharomyces boulardii  250 mg Oral BID  . sodium chloride flush  10-40 mL Intracatheter Q12H   ondansetron **OR** ondansetron (ZOFRAN) IV, sodium chloride flush Allergies: Patient has no known allergies.

## 2018-04-23 NOTE — Progress Notes (Deleted)
Summary 1) Acute blood loss anemia in setting of UC 2) intermittent fluid and electrolyte imbalance  3) left upper extremity swelling       To Do 1) trend cbc. Accept BP 80s. Cont Imuran and pred. F/u GI.  2) f/u bmp 3) f/u swelling left UE. May need [place] eval   Labs Labs: WBC/Hgb/Hct/Plts:  7.7/7.7/23.3/190 (08/08 0406) BUN/Cr/glu/ALT/AST/amyl/lip:  11/0.78/--/--/--/--/-- (08/08 0406)   Na/K/Cl/CO2:  133/3.5/102/24 (08/08 0406) Recent Results (from the past 720 hour(s))  Culture, blood (routine x 2)     Status: None   Collection Time: 04/17/18  4:04 PM  Result Value Ref Range Status   Specimen Description   Final    BLOOD RIGHT ANTECUBITAL Performed at [facility] [address] [place], [place] [address]    Special Requests   Final    BOTTLES DRAWN AEROBIC AND ANAEROBIC Blood Culture adequate volume Performed at [facility] [address] [place], [place] [address]    Culture   Final    NO GROWTH 5 DAYS Performed at [facility], [place] [address] [place], [place] [address]    Report Status 04/22/2018 FINAL  Final  Culture, blood (routine x 2)     Status: None   Collection Time: 04/17/18  4:30 PM  Result Value Ref Range Status   Specimen Description   Final    BLOOD RIGHT ANTECUBITAL Performed at [facility] [address], [place], [place] [address]    Special Requests   Final    BOTTLES DRAWN AEROBIC AND ANAEROBIC Blood Culture adequate volume Performed at [facility] [address], [place], [place] [address]    Culture   Final    NO GROWTH 5 DAYS Performed at [facility], [place] [address], [place], [place] [address]    Report Status 04/22/2018 FINAL  Final  Culture, Urine     Status: None   Collection Time: 04/17/18  4:31 PM  Result Value Ref Range Status   Specimen Description   Final    URINE, RANDOM Performed at [facility]  [address] [place], [place] [address]    Special Requests   Final    NONE Performed at [facility], [place] [address] [place], [place] [address]    Culture   Final    NO GROWTH Performed at [facility], [place] [address], [place], [place] [address]    Report Status 04/18/2018 FINAL  Final  MRSA PCR Screening     Status: None   Collection Time: 04/17/18  6:02 PM  Result Value Ref Range Status   MRSA by PCR NEGATIVE NEGATIVE Final    Comment:        The GeneXpert MRSA Assay (FDA approved for NASAL specimens only), is one component of a comprehensive MRSA colonization surveillance program. It is not intended to diagnose MRSA infection nor to guide or monitor treatment for MRSA infections. Performed at [facility], [place] [address] [place],  [address]         Medications Medications: . azaTHIOprine  75 mg Oral Daily  . Chlorhexidine Gluconate Cloth  6 each Topical Daily  . feeding supplement  1 Container Oral BID BM  . feeding supplement (PRO-STAT SUGAR FREE 64)  30 mL Oral BID  . multivitamin with minerals  1 tablet Oral Daily  . potassium chloride  40 mEq Oral Once  . predniSONE  10  mg Oral Q breakfast  . saccharomyces boulardii  250 mg Oral BID  . sodium chloride flush  10-40 mL Intracatheter Q12H   ondansetron **OR** ondansetron (ZOFRAN) IV, sodium chloride flush Allergies: Patient has no known allergies.

## 2018-04-23 NOTE — Progress Notes (Signed)
[facility] Progress Note  NAVEEN CLARDY 57 y.o. July 11, 1961   Subjective: Feels good. Denies rectal bleeding. Denies abdominal pain. Loose stool this morning.  Objective: Vital signs: Vitals:   04/23/18 0900 04/23/18 1200  BP: 97/72   Pulse: 76   Resp: 13   Temp:  98.6 F (37 C)  SpO2: 96%     Physical Exam: Gen: alert, no acute distress, thin  HEENT: anicteric sclera CV: RRR Chest: CTA B Abd: soft, nontender, nondistended, +BS Ext: no edema  Lab Results: Recent Labs    04/21/18 0344 04/22/18 0634 04/23/18 0406  NA 132* 134* 133*  K 3.2* 3.4* 3.5  CL 100 102 102  CO2 26 27 24   GLUCOSE 97 80 59*  BUN 12 12 11   CREATININE 0.77 0.79 0.78  CALCIUM 7.6* 7.5* 7.6*  MG 1.9  --   --   PHOS 2.4*  --   --    No results for input(s): AST, ALT, ALKPHOS, BILITOT, PROT, ALBUMIN in the last 72 hours. Recent Labs    04/22/18 0634 04/22/18 1800 04/23/18 0406  WBC 9.6  --  7.7  HGB 7.3* 9.1* 7.7*  HCT 22.5* 27.8* 23.3*  MCV 85.9  --  84.7  PLT 175  --  190      Assessment/Plan: Ulcerative pancolitis clinically improving on steroids (on oral Prednisone 10 mg/day). Hgb 7.7 (9.1, 7.3). Will increase to 40 mg/day and needs to remain on that dose until his f/u with Dr. Alessandra Bevels who he sees again on 04/28/18. Due for 2nd induction infusion of Vedolizumab next week. He reports chronically low BPs in the low 478'G systolic. Continue supportive care. Will sign off. Call us back if questions.   [place] C. 04/23/2018, 2:18 PM  Questions please call [phone] ID: INES WARF, male   DOB: Oct 06, 1960, 57 y.o.   MRN: [id number]

## 2018-04-23 NOTE — Care Management Note (Signed)
Case Management Note  Patient Details  Name: DAMETRIUS SANJUAN MRN: [id number] Date of Birth: [birth year]  Subjective/Objective:    Hypotension and ulcerative colitis with bleeding/hgb down to 7.7 from 9.1 today/ bld. In stools/ iv lr at 125cc/hr/pressors off and on.  bld cultures x2 neg at 5 days.  Transitioned from [place] 02 to room air/                Action/Plan: Will follow for hhc needs. None present at time of review.  Expected Discharge Date:  (unknown)               Expected Discharge Plan:     In-House Referral:     Discharge planning Services     Post Acute Care Choice:    Choice offered to:     DME Arranged:    DME Agency:     HH Arranged:    HH Agency:     Status of Service:     If discussed at [employer], dates discussed:    Additional Comments:  Leeroy Cha, RN 04/23/2018, 10:09 AM

## 2018-04-23 NOTE — Progress Notes (Signed)
Walked 3 laps totally asymptomatic.  No transfusion warranted  Erick Colace ACNP-BC [place] Pager # [phone] OR # [phone] if no answer

## 2018-04-24 ENCOUNTER — Inpatient Hospital Stay (HOSPITAL_COMMUNITY): Payer: BLUE CROSS/BLUE SHIELD

## 2018-04-24 DIAGNOSIS — E440 Moderate protein-calorie malnutrition: Secondary | ICD-10-CM

## 2018-04-24 DIAGNOSIS — K922 Gastrointestinal hemorrhage, unspecified: Secondary | ICD-10-CM

## 2018-04-24 DIAGNOSIS — R609 Edema, unspecified: Secondary | ICD-10-CM

## 2018-04-24 DIAGNOSIS — I82A11 Acute embolism and thrombosis of right axillary vein: Secondary | ICD-10-CM

## 2018-04-24 LAB — BASIC METABOLIC PANEL
Anion gap: 5 (ref 5–15)
BUN: 14 mg/dL (ref 6–20)
CO2: 29 mmol/L (ref 22–32)
CREATININE: 0.84 mg/dL (ref 0.61–1.24)
Calcium: 7.2 mg/dL — ABNORMAL LOW (ref 8.9–10.3)
Chloride: 99 mmol/L (ref 98–111)
GFR calc Af Amer: >60 mL/min (ref >60)
GFR calc non Af Amer: >60 mL/min (ref >60)
GLUCOSE: 118 mg/dL — AB (ref 70–99)
Potassium: 3.3 mmol/L — ABNORMAL LOW (ref 3.5–5.1)
Sodium: 133 mmol/L — ABNORMAL LOW (ref 135–145)

## 2018-04-24 LAB — CBC
HEMATOCRIT: 23.7 % — AB (ref 39.0–52.0)
Hemoglobin: 7.7 g/dL — ABNORMAL LOW (ref 13.0–17.0)
MCH: 27.5 pg (ref 26.0–34.0)
MCHC: 32.5 g/dL (ref 30.0–36.0)
MCV: 84.6 fL (ref 78.0–100.0)
Platelets: 167 10*3/uL (ref 150–400)
RBC: 2.8 MIL/uL — ABNORMAL LOW (ref 4.22–5.81)
RDW: 14.5 % (ref 11.5–15.5)
WBC: 7.8 10*3/uL (ref 4.0–10.5)

## 2018-04-24 MED ORDER — PREDNISONE 20 MG PO TABS: 40.0000 mg | ORAL_TABLET | Freq: Every day | ORAL | 0 refills | Status: DC

## 2018-04-24 NOTE — Progress Notes (Signed)
Patient ID: Zachary Horn, male   DOB: Jun 12, 1961, 57 y.o.   MRN: [id number]  Feels good. Denies rectal bleeding. Hgb 7.7. Tolerating diet. Agree with D/C today. Keep f/u with Dr. Alessandra Bevels on 04/28/18 and Entyvio infusion as previously planned for next week.

## 2018-04-24 NOTE — Progress Notes (Signed)
RN notified that patient is positive for a clot in the right upper extremity.  RN updated Dr. Ree Kida, received no new orders at this time.  Will continue to monitor.

## 2018-04-24 NOTE — Discharge Instructions (Signed)
Anemia Anemia is a condition in which you do not have enough red blood cells or hemoglobin. Hemoglobin is a substance in red blood cells that carries oxygen. When you do not have enough red blood cells or hemoglobin (are anemic), your body cannot get enough oxygen and your organs may not work properly. As a result, you may feel very tired or have other problems. What are the causes? Common causes of anemia include:  Excessive bleeding. Anemia can be caused by excessive bleeding inside or outside the body, including bleeding from the intestine or from periods in women.  Poor nutrition.  Long-lasting (chronic) kidney, thyroid, and liver disease.  Bone marrow disorders.  Cancer and treatments for cancer.  HIV (human immunodeficiency virus) and AIDS (acquired immunodeficiency syndrome).  Treatments for HIV and AIDS.  Spleen problems.  Blood disorders.  Infections, medicines, and autoimmune disorders that destroy red blood cells.  What are the signs or symptoms? Symptoms of this condition include:  Minor weakness.  Dizziness.  Headache.  Feeling heartbeats that are irregular or faster than normal (palpitations).  Shortness of breath, especially with exercise.  Paleness.  Cold sensitivity.  Indigestion.  Nausea.  Difficulty sleeping.  Difficulty concentrating.  Symptoms may occur suddenly or develop slowly. If your anemia is mild, you may not have symptoms. How is this diagnosed? This condition is diagnosed based on:  Blood tests.  Your medical history.  A physical exam.  Bone marrow biopsy.  Your health care provider may also check your stool (feces) for blood and may do additional testing to look for the cause of your bleeding. You may also have other tests, including:  Imaging tests, such as a CT scan or MRI.  Endoscopy.  Colonoscopy.  How is this treated? Treatment for this condition depends on the cause. If you continue to lose a lot of blood,  you may need to be treated at a hospital. Treatment may include:  Taking supplements of iron, vitamin V70, or folic acid.  Taking a hormone medicine (erythropoietin) that can help to stimulate red blood cell growth.  Having a blood transfusion. This may be needed if you lose a lot of blood.  Making changes to your diet.  Having surgery to remove your spleen.  Follow these instructions at home:  Take over-the-counter and prescription medicines only as told by your health care provider.  Take supplements only as told by your health care provider.  Follow any diet instructions that you were given.  Keep all follow-up visits as told by your health care provider. This is important. Contact a health care provider if:  You develop new bleeding anywhere in the body. Get help right away if:  You are very weak.  You are short of breath.  You have pain in your abdomen or chest.  You are dizzy or feel faint.  You have trouble concentrating.  You have bloody or black, tarry stools.  You vomit repeatedly or you vomit up blood. Summary  Anemia is a condition in which you do not have enough red blood cells or enough of a substance in your red blood cells that carries oxygen (hemoglobin).  Symptoms may occur suddenly or develop slowly.  If your anemia is mild, you may not have symptoms.  This condition is diagnosed with blood tests as well as a medical history and physical exam. Other tests may be needed.  Treatment for this condition depends on the cause of the anemia. This information is not intended to replace advice  given to you by your health care provider. Make sure you discuss any questions you have with your health care provider. Document Released: 10/10/2004 Document Revised: 10/04/2016 Document Reviewed: 10/04/2016 Elsevier Interactive Patient Education  2018 [employer].  Hypotension As your heart beats, it forces blood through your body. This force is called blood  pressure. If you have hypotension, you have low blood pressure. When your blood pressure is too low, you may not get enough blood to your brain. You may feel weak, feel light-headed, have a fast heartbeat, or even pass out (faint). Follow these instructions at home: Eating and drinking  Drink enough fluids to keep your pee (urine) clear or pale yellow.  Eat a healthy diet, and follow instructions from your doctor about eating or drinking restrictions. A healthy diet includes: ? Fresh fruits and vegetables. ? Whole grains. ? Low-fat (lean) meats. ? Low-fat dairy products.  Eat extra salt only as told. Do not add extra salt to your diet unless your doctor tells you to.  Eat small meals often.  Avoid standing up quickly after you eat. Medicines  Take over-the-counter and prescription medicines only as told by your doctor. ? Follow instructions from your doctor about changing how much you take (the dosage) of your medicines, if this applies. ? Do not stop or change your medicine on your own. General instructions  Wear compression stockings as told by your doctor.  Get up slowly from lying down or sitting.  Avoid hot showers and a lot of heat as told by your doctor.  Return to your normal activities as told by your doctor. Ask what activities are safe for you.  Do not use any products that contain nicotine or tobacco, such as cigarettes and e-cigarettes. If you need help quitting, ask your doctor.  Keep all follow-up visits as told by your doctor. This is important. Contact a doctor if:  You throw up (vomit).  You have watery poop (diarrhea).  You have a fever for more than 2-3 days.  You feel more thirsty than normal.  You feel weak and tired. Get help right away if:  You have chest pain.  You have a fast or irregular heartbeat.  You lose feeling (get numbness) in any part of your body.  You cannot move your arms or your legs.  You have trouble talking.  You get  sweaty or feel light-headed.  You faint.  You have trouble breathing.  You have trouble staying awake.  You feel confused. This information is not intended to replace advice given to you by your health care provider. Make sure you discuss any questions you have with your health care provider. Document Released: 11/27/2009 Document Revised: 05/21/2016 Document Reviewed: 05/21/2016 Elsevier Interactive Patient Education  2017 [employer].

## 2018-04-24 NOTE — Progress Notes (Signed)
Patient was given discharge instructions and AVS paperwork.  All questions answered. Patient is being discharged.

## 2018-04-24 NOTE — Progress Notes (Signed)
Bilateral upper extremity venous duplex has been completed. 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 There is also evidence of age indeterminate superficial vein thrombosis involving the cephalic vein of the right upper extremity. Negative for DVT on the left.  Bilateral lower extremity venous duplex has been completed.   Negative for DVT.  Results were given to the patient's nurse, Pamala Hurry.  04/24/18 11:12 AM Carlos Levering RVT

## 2018-04-24 NOTE — Care Management Note (Signed)
Case Management Note  Patient Details  Name: Zachary Horn MRN: [id number] Date of Birth: 1961-03-07  Subjective/Objective:  Ulcerative pan colitis-improving on p.o. Steroids no loose stools overnight.                  Action/Plan: No cm needs at this time will continue to follow  Expected Discharge Date:  (unknown)               Expected Discharge Plan:  Home/Self Care  In-House Referral:     Discharge planning Services  CM Consult  Post Acute Care Choice:    Choice offered to:     DME Arranged:    DME Agency:     HH Arranged:    HH Agency:     Status of Service:  In process, will continue to follow  If discussed at Long Length of Stay Meetings, dates discussed:    Additional Comments:  Leeroy Cha, RN 04/24/2018, 9:54 AM

## 2018-04-24 NOTE — Discharge Summary (Signed)
Physician Discharge Summary  Zachary Horn HBZ:[id number] DOB: September 11, 1961 DOA: 04/17/2018  PCP: Merrilee Seashore, MD  Admit date: 04/17/2018 Discharge date: 04/24/2018  Time spent: 45 minutes  Recommendations for Outpatient Follow-up:  Patient will be discharged to home.  Patient will need to follow up with primary care provider within one week of discharge, repeat CBC and BMP.  Follow up with Dr. Alessandra Bevels on 04/28/2018.  Patient should continue medications as prescribed.  Patient should follow a soft diet.   Discharge Diagnoses:  Hypovolemic/hemorrhagic shock secondary to GI bleed SIRS in the setting of ulcerative colitis Hypokalemia  LUE swelling RUE DVT Hyponatremia syncope  Discharge Condition: Stable  Diet recommendation: soft  Filed Weights   04/17/18 1715 04/18/18 0500 04/20/18 0453  Weight: 55.8 kg 61.5 kg 71.4 kg    History of present illness:  On 04/17/2018 by Dr. Fayrene Helper Zachary Horn is a 57 y.o. male with medical history significant of recently diagnosed ulcerative colitis presenting with SOB, LH, fainting spells over the past month.    She notes that over the past month he has had shortness of breath, lightheadedness with standing, fainting spells.  These have gotten worse over the past 3 to 4 days.  He notes worsening shortness of breath with exertion.  He is begun to walk with a cane because of his general fatigue.  He also uses an office chair on wheels to get around his kitchen.  He fainted 3-4 times over the past few days when getting out of bed at night.  He did bump his head once in his left shoulder.  He denies any pain at this time.  He denies any chest pain, fevers.  He does note chills.  He notes abdominal pain from the colitis which she notes is a little bit better.  Diarrhea is also a little bit better.  The stool is dark brown in color and he has not seen any blood recently.   Hospital Course:  Hypovolemic/hemorrhagic shock secondary to GI  bleed -presented with hemoglobin on 6.2 and hypotension -Did require ICU admission with pressor support -Currently blood pressure appears to be in the low 10F systolically which per patient is normal for him.  He is asymptomatic. -Echocardiogram obtained: EF 55 to 60%, no regional wall motion abnormalities -patient did received 2u blood transfusion during admission -Gastroenterology consulted and appreciated -hemoglobin has currently remained stable of the past couple of days, 7.7 -repeat CBC in one week  SIRS in the setting of ulcerative colitis -patient presented with fever and hypotension -Continue Imuran, prednisone -blood and urine cultures showed no growth to date -Follow-up with GI, Dr. Alessandra Bevels on 04/28/2018- patient will receive his second induction dose of Vedolizumab   Hypokalemia  -Resolved  LUE swelling -Obtained doppler, negative for DVT  RUE DVT -US obtained, showing age-indeterminate DVT involving axillary, brachial veins.  Age-indeterminate superficial vein thrombosis involving cephalic vein. -LE Doppler negative for DVT -Given GI bleed, patient is not a candidate for anticoagulation at this time -?incidental -discussed with vascular surgery, Dr. Donnetta Hutching, would not recommend follow up- if anticoagulation was a possibility, treat for 3-6 months, however given GI bleed cannot anticoagulate. Recommended follow up only if arm becomes edematous.  Hyponatremia -sodium was 124 on admission -currently 133  Syncope -suspect secondary to anemia and hypotension -currently asymptomatic, see discussion above  Procedures: Echocardiogram Upper and lower ext doppler  Consultations: PCCM Gastroenterology  Discharge Exam: Vitals:   04/24/18 1200 04/24/18 1300  BP:  Pulse: 84 78  Resp:    Temp: 98.4 F (36.9 C)   SpO2: 97% 96%   Patient states he is feeling much better today.  Stools more formed, denies seeing any dark or bloody stools.  Denies current chest  pain, shortness breath, abdominal pain, nausea or vomiting,  dizziness or headache.  Like to go home today.   General: Well developed, well nourished, NAD, appears stated age  57: NCAT,  mucous membranes moist.  Neck: Supple  Cardiovascular: S1 S2 auscultated, RRR, no murmur  Respiratory: Clear to auscultation bilaterally with equal chest rise  Abdomen: Soft, nontender, nondistended, + bowel sounds  Extremities: warm dry without cyanosis clubbing or edema  Neuro: AAOx3, nonfocal  Skin: Without rashes exudates or nodules  Psych: Normal affect and demeanor with intact judgement and insight  Discharge Instructions Discharge Instructions    Discharge instructions   Complete by:  As directed    Patient will be discharged to home.  Patient will need to follow up with primary care provider within one week of discharge, repeat CBC and BMP.  Follow up with Dr. Alessandra Bevels on 04/28/2018.  Patient should continue medications as prescribed.  Patient should follow a soft diet.     Allergies as of 04/24/2018   No Known Allergies     Medication List    TAKE these medications   azaTHIOprine 50 MG tablet Commonly known as:  IMURAN Take 75 mg by mouth daily.   ENTYVIO IV Inject into the vein See admin instructions. Next injection 04/27/2018, once monthly thereafter   INTEGRA 62.5-62.5-40-3 MG Caps Take 1 tablet by mouth 2 (two) times daily.   LORazepam 1 MG tablet Commonly known as:  ATIVAN Take 1 tablet (1 mg total) by mouth at bedtime.   predniSONE 20 MG tablet Commonly known as:  DELTASONE Take 2 tablets (40 mg total) by mouth daily with breakfast. Start taking on:  04/25/2018 What changed:    medication strength  how much to take  when to take this      No Known Allergies Follow-up Information    Merrilee Seashore, MD. Schedule an appointment as soon as possible for a visit in 1 week(s).   Specialty:  Internal Medicine Why:  Hospital follow up Contact  information: [address] [place] [address] [phone]        Otis Brace, MD. Go on 04/28/2018.   Specialty:  Gastroenterology Contact information: [place] [address] [phone]            The results of significant diagnostics from this hospitalization (including imaging, microbiology, ancillary and laboratory) are listed below for reference.    Significant Diagnostic Studies: Dg Chest 2 View  Result Date: 03/30/2018 CLINICAL DATA:  Pt to start new medication for Ulcerative pancolitis with rectal bleeding, recent TB test inconclusive per pt, some s.o.b today EXAM: CHEST - 2 VIEW COMPARISON:  None. FINDINGS: Cardiac silhouette is normal in size and configuration. Normal mediastinal and hilar contours. Clear lungs.  No pleural effusion or pneumothorax. Skeletal structures are unremarkable. IMPRESSION: No active cardiopulmonary disease. Electronically Signed   By: Lajean Manes M.D.   On: 03/30/2018 14:05   Dg Abd 1 View  Result Date: 04/17/2018 CLINICAL DATA:  Hypotension fever with ulcerative colitis EXAM: ABDOMEN - 1 VIEW COMPARISON:  None. FINDINGS: Air-filled but nondistended bowel in the central abdomen. Slightly featureless appearance of right abdominal loops. Phleboliths in the pelvis. IMPRESSION: Nonobstructed gas pattern. Slightly  featureless appearance of bowel within the central and right abdomen, possible bowel wall thickening. Electronically Signed   By: Donavan Foil M.D.   On: 04/17/2018 18:28   Dg Chest Port 1 View  Result Date: 04/17/2018 CLINICAL DATA:  Shortness of breath.  Lightheaded. EXAM: PORTABLE CHEST 1 VIEW COMPARISON:  03/30/2018. FINDINGS: Normal sized heart. Clear lungs with normal vascularity. Diffuse osteopenia and lower thoracic spine degenerative changes. IMPRESSION: No acute abnormality. Electronically Signed   By: Claudie Revering M.D.   On: 04/17/2018 16:26   [place] Ekg Site Rite  Result Date:  04/19/2018 If Site Rite image not attached, placement could not be confirmed due to current cardiac rhythm.   Microbiology: Recent Results (from the past 240 hour(s))  Culture, blood (routine x 2)     Status: None   Collection Time: 04/17/18  4:04 PM  Result Value Ref Range Status   Specimen Description   Final    BLOOD RIGHT ANTECUBITAL Performed at [facility] [address], [place], [place] [address]    Special Requests   Final    BOTTLES DRAWN AEROBIC AND ANAEROBIC Blood Culture adequate volume Performed at [facility] [address] [place], [place] [address]    Culture   Final    NO GROWTH 5 DAYS Performed at [facility], [place] [address] [place], [place] [address]    Report Status 04/22/2018 FINAL  Final  Culture, blood (routine x 2)     Status: None   Collection Time: 04/17/18  4:30 PM  Result Value Ref Range Status   Specimen Description   Final    BLOOD RIGHT ANTECUBITAL Performed at [facility] [address] [place], [place] [address]    Special Requests   Final    BOTTLES DRAWN AEROBIC AND ANAEROBIC Blood Culture adequate volume Performed at [facility] [address] [place], [place] [address]    Culture   Final    NO GROWTH 5 DAYS Performed at [facility], [place] [address] [place], [place] [address]    Report Status 04/22/2018 FINAL  Final  Culture, Urine     Status: None   Collection Time: 04/17/18  4:31 PM  Result Value Ref Range Status   Specimen Description   Final    URINE, RANDOM Performed at [facility] [address] [place] [address], [place], [place] [address]    Special Requests   Final    NONE Performed at [facility], [place] [address] [place], [place] [address]    Culture   Final    NO GROWTH Performed at [facility], [place] [address] [place],  [address]    Report Status 04/18/2018 FINAL  Final  MRSA PCR  Screening     Status: None   Collection Time: 04/17/18  6:02 PM  Result Value Ref Range Status   MRSA by PCR NEGATIVE NEGATIVE Final    Comment:        The GeneXpert MRSA Assay (FDA approved for NASAL specimens only), is one component of a comprehensive MRSA colonization surveillance program. It is not intended to diagnose MRSA infection nor to guide or monitor treatment for MRSA infections. Performed at [facility], [place] [address] [place],  [address]      Labs: Basic Metabolic Panel: Recent Labs  Lab 04/17/18 1838  04/18/18 7062 04/19/18 3762 04/20/18 8315 04/21/18 0344 04/22/18 1761 04/23/18 0406 04/24/18 1100  NA  --    < > 131* 130* 130* 132* 134* 133* 133*  K  --    < > 3.6 4.0 3.2* 3.2* 3.4* 3.5 3.3*  CL  --    < > 97* 97* 100 100 102 102 99  CO2  --    < > 24 23 24 26 27 24 29   GLUCOSE  --    < > 153* 125* 121* 97 80 59* 118*  BUN  --    < > 9 10 12 12 12 11 14   CREATININE  --    < > 0.83 0.90 0.84 0.77 0.79 0.78 0.84  CALCIUM  --    < > 7.6* 7.9* 7.5* 7.6* 7.5* 7.6* 7.2*  MG 1.7  --  2.0 1.8 2.0 1.9  --   --   --   PHOS  --   --   --  3.5 2.5 2.4*  --   --   --    < > = values in this interval not displayed.   Liver Function Tests: Recent Labs  Lab 04/18/18 0335 04/19/18 0218  AST 17 16  ALT 12 12  ALKPHOS 93 90  BILITOT 0.5 0.3  PROT 5.2* 5.0*  ALBUMIN 1.5* 1.3*   No results for input(s): LIPASE, AMYLASE in the last 168 hours. No results for input(s): AMMONIA in the last 168 hours. CBC: Recent Labs  Lab 04/18/18 0335 04/19/18 0218 04/20/18 0450 04/21/18 0344 04/22/18 0634 04/22/18 1800 04/23/18 0406 04/24/18 0336  WBC 7.7 11.9* 16.2* 11.4* 9.6  --  7.7 7.8  NEUTROABS 7.3 11.0* 15.4*  --   --   --   --   --   HGB 8.9* 8.9* 8.2* 8.2* 7.3* 9.1* 7.7* 7.7*  HCT 26.7* 26.6* 24.4* 24.7* 22.5* 27.8* 23.3* 23.7*  MCV 84.5 84.7 84.1 84.6 85.9  --  84.7 84.6  PLT 355 323 296 233 175  --  190 167   Cardiac  Enzymes: Recent Labs  Lab 04/19/18 0830 04/20/18 0450  TROPONINI <0.03 <0.03   BNP: BNP (last 3 results) No results for input(s): BNP in the last 8760 hours.  ProBNP (last 3 results) No results for input(s): PROBNP in the last 8760 hours.  CBG: Recent Labs  Lab 04/23/18 0806  GLUCAP 81       Signed:  Kamaron Deskins  [facility] 04/24/2018, 2:00 PM

## 2018-04-27 DIAGNOSIS — K51011 Ulcerative (chronic) pancolitis with rectal bleeding: Secondary | ICD-10-CM | POA: Diagnosis not present

## 2018-04-28 DIAGNOSIS — K5100 Ulcerative (chronic) pancolitis without complications: Secondary | ICD-10-CM | POA: Diagnosis not present

## 2018-04-28 DIAGNOSIS — D649 Anemia, unspecified: Secondary | ICD-10-CM | POA: Diagnosis not present

## 2018-04-30 DIAGNOSIS — K5190 Ulcerative colitis, unspecified, without complications: Secondary | ICD-10-CM | POA: Diagnosis not present

## 2018-04-30 DIAGNOSIS — E861 Hypovolemia: Secondary | ICD-10-CM | POA: Diagnosis not present

## 2018-04-30 DIAGNOSIS — R040 Epistaxis: Secondary | ICD-10-CM | POA: Diagnosis not present

## 2018-05-25 DIAGNOSIS — K51011 Ulcerative (chronic) pancolitis with rectal bleeding: Secondary | ICD-10-CM | POA: Diagnosis not present

## 2018-05-26 DIAGNOSIS — M6281 Muscle weakness (generalized): Secondary | ICD-10-CM | POA: Diagnosis not present

## 2018-05-27 DIAGNOSIS — K5100 Ulcerative (chronic) pancolitis without complications: Secondary | ICD-10-CM | POA: Diagnosis not present

## 2018-05-27 DIAGNOSIS — D649 Anemia, unspecified: Secondary | ICD-10-CM | POA: Diagnosis not present

## 2018-06-10 ENCOUNTER — Encounter: Payer: Self-pay | Admitting: Neurology

## 2018-06-12 DIAGNOSIS — R200 Anesthesia of skin: Secondary | ICD-10-CM | POA: Diagnosis not present

## 2018-06-12 DIAGNOSIS — D649 Anemia, unspecified: Secondary | ICD-10-CM | POA: Diagnosis not present

## 2018-06-12 DIAGNOSIS — R5383 Other fatigue: Secondary | ICD-10-CM | POA: Diagnosis not present

## 2018-06-12 DIAGNOSIS — Z23 Encounter for immunization: Secondary | ICD-10-CM | POA: Diagnosis not present

## 2018-06-12 DIAGNOSIS — D500 Iron deficiency anemia secondary to blood loss (chronic): Secondary | ICD-10-CM | POA: Diagnosis not present

## 2018-06-12 DIAGNOSIS — R202 Paresthesia of skin: Secondary | ICD-10-CM | POA: Diagnosis not present

## 2018-06-22 DIAGNOSIS — K5100 Ulcerative (chronic) pancolitis without complications: Secondary | ICD-10-CM | POA: Diagnosis not present

## 2018-06-22 DIAGNOSIS — G609 Hereditary and idiopathic neuropathy, unspecified: Secondary | ICD-10-CM | POA: Diagnosis not present

## 2018-06-22 DIAGNOSIS — D500 Iron deficiency anemia secondary to blood loss (chronic): Secondary | ICD-10-CM | POA: Diagnosis not present

## 2018-07-21 DIAGNOSIS — E559 Vitamin D deficiency, unspecified: Secondary | ICD-10-CM | POA: Diagnosis not present

## 2018-07-21 DIAGNOSIS — D649 Anemia, unspecified: Secondary | ICD-10-CM | POA: Diagnosis not present

## 2018-07-21 DIAGNOSIS — K5100 Ulcerative (chronic) pancolitis without complications: Secondary | ICD-10-CM | POA: Diagnosis not present

## 2018-07-27 DIAGNOSIS — K51011 Ulcerative (chronic) pancolitis with rectal bleeding: Secondary | ICD-10-CM | POA: Diagnosis not present

## 2018-08-27 DIAGNOSIS — D500 Iron deficiency anemia secondary to blood loss (chronic): Secondary | ICD-10-CM | POA: Diagnosis not present

## 2018-08-27 DIAGNOSIS — Z0000 Encounter for general adult medical examination without abnormal findings: Secondary | ICD-10-CM | POA: Diagnosis not present

## 2018-09-02 DIAGNOSIS — Z0000 Encounter for general adult medical examination without abnormal findings: Secondary | ICD-10-CM | POA: Diagnosis not present

## 2018-09-02 DIAGNOSIS — J45909 Unspecified asthma, uncomplicated: Secondary | ICD-10-CM | POA: Diagnosis not present

## 2018-09-02 DIAGNOSIS — K5100 Ulcerative (chronic) pancolitis without complications: Secondary | ICD-10-CM | POA: Diagnosis not present

## 2018-09-02 DIAGNOSIS — D500 Iron deficiency anemia secondary to blood loss (chronic): Secondary | ICD-10-CM | POA: Diagnosis not present

## 2018-09-04 ENCOUNTER — Other Ambulatory Visit (INDEPENDENT_AMBULATORY_CARE_PROVIDER_SITE_OTHER): Payer: BLUE CROSS/BLUE SHIELD

## 2018-09-04 ENCOUNTER — Encounter

## 2018-09-04 ENCOUNTER — Ambulatory Visit: Payer: BLUE CROSS/BLUE SHIELD | Admitting: Neurology

## 2018-09-04 ENCOUNTER — Encounter: Payer: Self-pay | Admitting: Neurology

## 2018-09-04 VITALS — BP 100/70 | HR 56 | Ht 69.0 in | Wt 158.5 lb

## 2018-09-04 DIAGNOSIS — K529 Noninfective gastroenteritis and colitis, unspecified: Secondary | ICD-10-CM

## 2018-09-04 DIAGNOSIS — G63 Polyneuropathy in diseases classified elsewhere: Secondary | ICD-10-CM | POA: Diagnosis not present

## 2018-09-04 DIAGNOSIS — K6389 Other specified diseases of intestine: Secondary | ICD-10-CM | POA: Diagnosis not present

## 2018-09-04 LAB — FOLATE: Folate: >23.9 ng/mL (ref >5.9)

## 2018-09-04 NOTE — Progress Notes (Signed)
[facility] Clinic Note - Initial Visit   Date: 09/04/18  Zachary Horn MRN: [id number] DOB: June 02, 1961   Dear Dr. Alessandra Bevels,  Thank you for your kind referral of Zachary Horn for consultation of paresthesias. Although his history is well known to you, please allow [place] to reiterate it for the purpose of our medical record. The patient was accompanied to the clinic by self.     History of Present Illness: Zachary Horn is a 57 y.o. right-handed Caucasian male with ulcerative colitis (2019) presenting for evaluation of tingling of the hands and feet.    He was diagnosed with ulcerative colitis in July 2019 after presenting with anemia and has been treated with predniosne, azathioprine, and Entyvio.  He responded well to medication.  Since symptom onset, he has been experiencing numbness/tingling over the soles of the feet, worse on the right.  Initially he has some numbness over the palms of the hands and right buttocks, but this has since resolved.   He does not have tingling/numbness over the dorsum of the feet or hands.  He does not have imbalance or weakness.  He stays active and exercises at home 2-3 times per week.    He was previously drinking 4-5 times per week, drinking up to 3-5 liquor beverages for about 20 years.  He reduced his alcohol consumption about 15 years ago and now drinks socially only (special occasions only).  No history of diabetes or thyroid disease.    TSH and HbA1c was normal, per patient at his PCP's office.   Out-side paper records, electronic medical record, and images have been reviewed where available and summarized as:  Labs 05/28/2018:  CRP 9, vitamin B12 465, vitamin D 18*   Past Medical History:  Diagnosis Date  . Ulcerative colitis ([place])     No past surgical history on file.   Medications:  Outpatient Encounter Medications as of 09/04/2018  Medication Sig  . Fe Fum-FePoly-Vit C-Vit B3 (INTEGRA) 62.5-62.5-40-3  MG CAPS Take 1 tablet by mouth 2 (two) times daily.  . Vedolizumab (ENTYVIO IV) Inject into the vein See admin instructions. Next injection 04/27/2018, once monthly thereafter  . azaTHIOprine (IMURAN) 50 MG tablet Take 75 mg by mouth daily.  Marland Kitchen LORazepam (ATIVAN) 1 MG tablet Take 1 tablet (1 mg total) by mouth at bedtime. (Patient not taking: Reported on 04/17/2018)  . [DISCONTINUED] predniSONE (DELTASONE) 20 MG tablet Take 2 tablets (40 mg total) by mouth daily with breakfast.   No facility-administered encounter medications on file as of 09/04/2018.      Allergies: No Known Allergies  Family History: Family History  Problem Relation Age of Onset  . Stomach cancer Mother   . AAA (abdominal aortic aneurysm) Father   . Cardiomyopathy Father     Social History: Social History   Tobacco Use  . Smoking status: Current Some Day Smoker    Types: Cigars  . Smokeless tobacco: Never Used  Substance Use Topics  . Alcohol use: No  . Drug use: No   Social History   Social History Narrative  . Not on file    Review of Systems:  CONSTITUTIONAL: No fevers, chills, night sweats, or weight loss.   EYES: No visual changes or eye pain ENT: No hearing changes.  No history of nose bleeds.   RESPIRATORY: No cough, wheezing and shortness of breath.   CARDIOVASCULAR: Negative for chest pain, and palpitations.   GI: Negative for abdominal discomfort, blood in stools  or black stools.  No recent change in bowel habits.   GU:  No history of incontinence.   MUSCLOSKELETAL: No history of joint pain or swelling.  No myalgias.   SKIN: Negative for lesions, rash, and itching.   HEMATOLOGY/ONCOLOGY: Negative for prolonged bleeding, bruising easily, and swollen nodes.  No history of cancer.   ENDOCRINE: Negative for cold or heat intolerance, polydipsia or goiter.   PSYCH:  No depression or anxiety symptoms.   NEURO: As Above.   Vital Signs:  BP 100/70   Pulse (!) 56   Ht 5' 9"  (1.753 m)   Wt 158 lb  8 oz (71.9 kg)   SpO2 96%   BMI 23.41 kg/m    General Medical Exam:   General:  Well appearing, comfortable.   Eyes/ENT: see cranial nerve examination.   Neck: No masses appreciated.  Full range of motion without tenderness.  No carotid bruits. Respiratory:  Clear to auscultation, good air entry bilaterally.   Cardiac:  Regular rate and rhythm, no murmur.   Extremities:  No deformities, edema, or skin discoloration.  Skin:  No rashes or lesions.  Neurological Exam: MENTAL STATUS including orientation to time, place, person, recent and remote memory, attention span and concentration, language, and fund of knowledge is normal.  Speech is not dysarthric.  CRANIAL NERVES: II:  No visual field defects.  Unremarkable fundi.   III-IV-VI: Pupils equal round and reactive to light.  Normal conjugate, extra-ocular eye movements in all directions of gaze.  No nystagmus.  No ptosis.   V:  Normal facial sensation.     VII:  Normal facial symmetry and movements.  No pathologic facial reflexes.  VIII:  Normal hearing and vestibular function.   IX-X:  Normal palatal movement.   XI:  Normal shoulder shrug and head rotation.   XII:  Normal tongue strength and range of motion, no deviation or fasciculation.  MOTOR:  No atrophy, fasciculations or abnormal movements.  No pronator drift.  Tone is normal.    Right Upper Extremity:    Left Upper Extremity:    Deltoid  5/5   Deltoid  5/5   Biceps  5/5   Biceps  5/5   Triceps  5/5   Triceps  5/5   Wrist extensors  5/5   Wrist extensors  5/5   Wrist flexors  5/5   Wrist flexors  5/5   Finger extensors  5/5   Finger extensors  5/5   Finger flexors  5/5   Finger flexors  5/5   Dorsal interossei  5/5   Dorsal interossei  5/5   Abductor pollicis  5/5   Abductor pollicis  5/5   Tone (Ashworth scale)  0  Tone (Ashworth scale)  0   Right Lower Extremity:    Left Lower Extremity:    Hip flexors  5/5   Hip flexors  5/5   Hip extensors  5/5   Hip extensors   5/5   Knee flexors  5/5   Knee flexors  5/5   Knee extensors  5/5   Knee extensors  5/5   Dorsiflexors  5/5   Dorsiflexors  5/5   Plantarflexors  5/5   Plantarflexors  5/5   Toe extensors  5/5   Toe extensors  5/5   Toe flexors  5/5   Toe flexors  5/5   Tone (Ashworth scale)  0  Tone (Ashworth scale)  0   MSRs:  Right  Left brachioradialis 2+  brachioradialis 2+  biceps 2+  biceps 2+  triceps 2+  triceps 2+  patellar 2+  patellar 2+  ankle jerk 2+  ankle jerk 2+  Hoffman no  Hoffman no  plantar response down  plantar response down   SENSORY:  Mildly reduced temperature over the palm and soles of the feet.  Vibration, pin prick, and light touch is intact throughout.  Romberg's sign absent.   COORDINATION/GAIT: Normal finger-to- nose-finger and heel-to-shin.  Intact rapid alternating movements bilaterally.  Able to rise from a chair without using arms.  Gait narrow based and stable. Tandem and stressed gait intact.    IMPRESSION: Paresthesias of the feet > hands in the setting of inflammatory bowel disease (ulcerative colitis) is most suggestive of early neuropathy. His neurological exam shows very mildly diminished temperature over the soles and palms, and otherwise intact sensation, reflexes, and motor strength. Treatment of underling ulcerative colitis has helped resolve his hand and right buttock paresthesias.  It was explained that given the length-dependent nature of neuropathy, recovery in the feet takes the longest time and there maybe some underlying permanent deficits, which is too early to determine.  I recommend NCS/EMG of the right arm and leg to help prognosis.  Further, a few additional labs will be checked for treatable causes of neuropathy - vitamin B1, copper, folate, SPEP with IFE. He was previously drinking alcohol heavily for about 20 years and quit in early 2000s, which can predispose him to neuropathy.  He  does not have diabetes or thyroid disease. Medications are not indicated, as symptoms are minimally bothersome.  Return to clinic in 6 months.   Thank you for allowing me to participate in patient's care.  If I can answer any additional questions, I would be pleased to do so.    Sincerely,    Donika K. Posey Pronto, DO

## 2018-09-04 NOTE — Patient Instructions (Addendum)
Check labs  NCS/EMG of the right arm and leg.  We will call you with the results  Return to clinic in 6 months  [place] (EMG/NCS) INSTRUCTIONS  How to Prepare The neurologist conducting the EMG will need to know if you have certain medical conditions. Tell the neurologist and other EMG lab personnel if you: . Have a pacemaker or any other electrical medical device . Take blood-thinning medications . Have hemophilia, a blood-clotting disorder that causes prolonged bleeding Bathing Take a shower or bath shortly before your exam in order to remove oils from your skin. Don't apply lotions or creams before the exam.  What to Expect You'll likely be asked to change into a hospital gown for the procedure and lie down on an examination table. The following explanations can help you understand what will happen during the exam.  . Electrodes. The neurologist or a technician places surface electrodes at various locations on your skin depending on where you're experiencing symptoms. Or the neurologist may insert needle electrodes at different sites depending on your symptoms.  . Sensations. The electrodes will at times transmit a tiny electrical current that you may feel as a twinge or spasm. The needle electrode may cause discomfort or pain that usually ends shortly after the needle is removed. If you are concerned about discomfort or pain, you may want to talk to the neurologist about taking a short break during the exam.  . Instructions. During the needle EMG, the neurologist will assess whether there is any spontaneous electrical activity when the muscle is at rest - activity that isn't present in healthy muscle tissue - and the degree of activity when you slightly contract the muscle.  He or she will give you instructions on resting and contracting a muscle at appropriate times. Depending on what muscles and nerves the neurologist is examining, he or she may ask you  to change positions during the exam.  After your EMG You may experience some temporary, minor bruising where the needle electrode was inserted into your muscle. This bruising should fade within several days. If it persists, contact your primary care doctor.

## 2018-09-08 LAB — IMMUNOFIXATION ELECTROPHORESIS
IgG (Immunoglobin G), Serum: 1285 mg/dL (ref 600–1640)
IgM, Serum: 108 mg/dL (ref 50–300)
Immunofix Electr Int: NOT DETECTED
Immunoglobulin A: 432 mg/dL — ABNORMAL HIGH (ref 47–310)

## 2018-09-08 LAB — PROTEIN ELECTROPHORESIS, SERUM
Albumin ELP: 3.9 g/dL (ref 3.8–4.8)
Alpha 1: 0.3 g/dL (ref 0.2–0.3)
Alpha 2: 0.7 g/dL (ref 0.5–0.9)
Beta 2: 0.5 g/dL (ref 0.2–0.5)
Beta Globulin: 0.5 g/dL (ref 0.4–0.6)
Gamma Globulin: 1.1 g/dL (ref 0.8–1.7)
Total Protein: 7 g/dL (ref 6.1–8.1)

## 2018-09-08 LAB — COPPER, SERUM: Copper: 112 ug/dL (ref 70–175)

## 2018-09-08 LAB — VITAMIN B1: Vitamin B1 (Thiamine): 41 nmol/L — ABNORMAL HIGH (ref 8–30)

## 2018-09-10 ENCOUNTER — Telehealth: Payer: Self-pay | Admitting: *Deleted

## 2018-09-10 NOTE — Telephone Encounter (Signed)
Left message giving patient results.  

## 2018-09-10 NOTE — Telephone Encounter (Signed)
-----   Message from Alda Berthold, DO sent at 09/10/2018  8:08 AM EST ----- Please notify patient lab are within normal limits.  Thank you.

## 2018-09-17 DIAGNOSIS — K51011 Ulcerative (chronic) pancolitis with rectal bleeding: Secondary | ICD-10-CM | POA: Diagnosis not present

## 2018-09-24 ENCOUNTER — Ambulatory Visit (INDEPENDENT_AMBULATORY_CARE_PROVIDER_SITE_OTHER): Payer: BLUE CROSS/BLUE SHIELD | Admitting: Neurology

## 2018-09-24 DIAGNOSIS — K6389 Other specified diseases of intestine: Secondary | ICD-10-CM

## 2018-09-24 DIAGNOSIS — G63 Polyneuropathy in diseases classified elsewhere: Secondary | ICD-10-CM

## 2018-09-24 DIAGNOSIS — K529 Noninfective gastroenteritis and colitis, unspecified: Secondary | ICD-10-CM

## 2018-09-24 NOTE — Procedures (Signed)
[facility]  [address], [address]  [place], [place] [address] Tel: [phone] Fax:  [phone] Test Date:  09/24/2018  Patient: Zachary Horn DOB: [birth year] Physician: Narda Amber, DO  Sex: Male Height: 5' 9"  Ref Phys: Narda Amber, DO  ID#: [id number] Temp: 32.0 Technician:    Patient Complaints: This is a 58 year old man with ulcerative colitis referred for evaluation of bilateral hand and feet paresthesias.  NCV & EMG Findings: Extensive electrodiagnostic testing of the right upper and lower extremity shows:  1. All sensory responses including the right median, ulnar, mixed palmar, sural, and superficial peroneal nerves are within normal limits. 2. All motor responses including the right median, ulnar, peroneal, and tibial nerves are within normal limits. 3. Right tibial H reflex study is within normal limits. 4. There is no evidence of active or chronic motor axonal loss changes affecting any of the tested muscles.  Motor unit configuration and recruitment pattern is within normal limits.  Impression: This is a normal study of the right side.  In particular, there is no evidence of a large fiber sensorimotor polyneuropathy, or cervical/lumbosacral radiculopathy.   ___________________________ Narda Amber, DO    Nerve Conduction Studies Anti Sensory Summary Table   Site NR Peak (ms) Norm Peak (ms) P-T Amp (V) Norm P-T Amp  Right Median Anti Sensory (2nd Digit)  32C  Wrist    3.5 <3.6 18.2 >15  Right Sup Peroneal Anti Sensory (Ant Lat Mall)  32C  12 cm    2.8 <4.6 18.9 >4  Right Sural Anti Sensory (Lat Mall)  32C  Calf    2.6 <4.6 31.7 >4  Right Ulnar Anti Sensory (5th Digit)  32C  Wrist    2.8 <3.1 18.1 >10   Motor Summary Table   Site NR Onset (ms) Norm Onset (ms) O-P Amp (mV) Norm O-P Amp Site1 Site2 Delta-0 (ms) Dist (cm) Vel (m/s) Norm Vel (m/s)  Right Median Motor (Abd Poll Brev)  32C  Wrist    3.6 <4.0 6.4 >6 Elbow Wrist 5.4 29.0 54  >50  Elbow    9.0  6.1  Axilla Elbow 4.1 0.0    Axilla    4.9  2.8         Right Peroneal Motor (Ext Dig Brev)  32C  Ankle    4.6 <6.0 3.7 >2.5 B Fib Ankle 8.4 40.0 48 >40  B Fib    13.0  3.3  Poplt B Fib 1.2 0.0  >40  Poplt    14.2  3.3         Right Tibial Motor (Abd Hall Brev)  32C  Ankle    4.4 <6.0 11.8 >4 Knee Ankle 8.6 39.0 45 >40  Knee    13.0  9.6         Right Ulnar Motor (Abd Dig Minimi)  32C  Wrist    2.4 <3.1 9.5 >7 B Elbow Wrist 4.3 25.0 58 >50  B Elbow    6.7  8.9  A Elbow B Elbow 2.0 10.0 50 >50  A Elbow    8.7  8.6          Comparison Summary Table   Site NR Peak (ms) Norm Peak (ms) P-T Amp (V) Site1 Site2 Delta-P (ms) Norm Delta (ms)  Right Median/Ulnar Palm Comparison (Wrist - 8cm)  32C  Median Palm    1.8 <2.2 73.4 Median Palm Ulnar Palm 0.3   Ulnar Palm    1.5 <2.2 14.5  H Reflex Studies   NR H-Lat (ms) Lat Norm (ms) L-R H-Lat (ms)  Right Tibial (Gastroc)  32C     33.33 <35    EMG   Side Muscle Ins Act Fibs Psw Fasc Number Recrt Dur Dur. Amp Amp. Poly Poly. Comment  Right 1stDorInt Nml Nml Nml Nml Nml Nml Nml Nml Nml Nml Nml Nml N/A  Right FlexPolLong Nml Nml Nml Nml Nml Nml Nml Nml Nml Nml Nml Nml N/A  Right Ext Indicis Nml Nml Nml Nml Nml Nml Nml Nml Nml Nml Nml Nml N/A  Right ABD Dig Min Nml Nml Nml Nml Nml Nml Nml Nml Nml Nml Nml Nml N/A  Right PronatorTeres Nml Nml Nml Nml Nml Nml Nml Nml Nml Nml Nml Nml N/A  Right AntTibialis Nml Nml Nml Nml Nml Nml Nml Nml Nml Nml Nml Nml N/A  Right Gastroc Nml Nml Nml Nml Nml Nml Nml Nml Nml Nml Nml Nml N/A  Right Flex Dig Long Nml Nml Nml Nml Nml Nml Nml Nml Nml Nml Nml Nml N/A  Right RectFemoris Nml Nml Nml Nml Nml Nml Nml Nml Nml Nml Nml Nml N/A  Right BicepsFemS Nml Nml Nml Nml Nml Nml Nml Nml Nml Nml Nml Nml N/A      Waveforms:

## 2018-10-06 DIAGNOSIS — K5100 Ulcerative (chronic) pancolitis without complications: Secondary | ICD-10-CM | POA: Diagnosis not present

## 2018-11-12 DIAGNOSIS — K51011 Ulcerative (chronic) pancolitis with rectal bleeding: Secondary | ICD-10-CM | POA: Diagnosis not present

## 2018-11-18 DIAGNOSIS — K5289 Other specified noninfective gastroenteritis and colitis: Secondary | ICD-10-CM | POA: Diagnosis not present

## 2018-11-18 DIAGNOSIS — K625 Hemorrhage of anus and rectum: Secondary | ICD-10-CM | POA: Diagnosis not present

## 2018-11-19 DIAGNOSIS — R197 Diarrhea, unspecified: Secondary | ICD-10-CM | POA: Diagnosis not present

## 2018-11-23 DIAGNOSIS — K5289 Other specified noninfective gastroenteritis and colitis: Secondary | ICD-10-CM | POA: Diagnosis not present

## 2018-12-02 DIAGNOSIS — R82998 Other abnormal findings in urine: Secondary | ICD-10-CM | POA: Diagnosis not present

## 2018-12-30 DIAGNOSIS — K51011 Ulcerative (chronic) pancolitis with rectal bleeding: Secondary | ICD-10-CM | POA: Diagnosis not present

## 2018-12-30 DIAGNOSIS — R748 Abnormal levels of other serum enzymes: Secondary | ICD-10-CM | POA: Diagnosis not present

## 2018-12-31 DIAGNOSIS — R748 Abnormal levels of other serum enzymes: Secondary | ICD-10-CM | POA: Diagnosis not present

## 2019-03-02 DIAGNOSIS — K51011 Ulcerative (chronic) pancolitis with rectal bleeding: Secondary | ICD-10-CM | POA: Diagnosis not present

## 2019-03-08 ENCOUNTER — Ambulatory Visit (INDEPENDENT_AMBULATORY_CARE_PROVIDER_SITE_OTHER): Payer: BC Managed Care – PPO | Admitting: Neurology

## 2019-03-08 ENCOUNTER — Other Ambulatory Visit: Payer: Self-pay

## 2019-03-08 ENCOUNTER — Encounter: Payer: Self-pay | Admitting: Neurology

## 2019-03-08 VITALS — BP 104/62 | HR 68 | Ht 69.0 in | Wt 150.2 lb

## 2019-03-08 DIAGNOSIS — R202 Paresthesia of skin: Secondary | ICD-10-CM

## 2019-03-08 NOTE — Patient Instructions (Signed)
Return to clinic as needed

## 2019-03-08 NOTE — Progress Notes (Signed)
Follow-up Visit   Date: 03/08/19   AVYAY COGER MRN: [id number] DOB: [birth year]   Interim History: ANDREWJAMES WEIRAUCH is a 58 y.o. right-handed Caucasian male with ulcerative colitis (2019) returning to the clinic for follow-up of tingling of the hands and feet.  The patient was accompanied to the clinic by self.  History of present illness: He was diagnosed with ulcerative colitis in July 2019 after presenting with anemia and has been treated with predniosne, azathioprine, and Entyvio.  He responded well to medication.  Since symptom onset, he has been experiencing numbness/tingling over the soles of the feet, worse on the right.  Initially he has some numbness over the palms of the hands and right buttocks, but this has since resolved.   He does not have tingling/numbness over the dorsum of the feet or hands.  He does not have imbalance or weakness.  He stays active and exercises at home 2-3 times per week.    He was previously drinking 4-5 times per week, drinking up to 3-5 liquor beverages for about 20 years.  He reduced his alcohol consumption about 15 years ago and now drinks socially only (special occasions only).  No history of diabetes or thyroid disease.    TSH and HbA1c was normal, per patient at his PCP's office.   UPDATE 03/08/2019: He is here for six-month follow-up visit. I reviewed his labs for secondary causes of neuropathy as well as EMG of the right side, which was normal.  Over the past 6 months, his numbness/tingling has significantly improved to the point where he barely notices it, reports it maybe ~5% present.  Unfortunately, since February 2020, he has been having a UC flare and switched from [facility] and azathioprine to Humira.  He is also on prednisone 76m daily.  He has lost about 8lb since December 2019.     Medications:  Current Outpatient Medications on File Prior to Visit  Medication Sig Dispense Refill  . Adalimumab (HUMIRA) 40 MG/0.4ML PSKT Inject into  the skin every 14 (fourteen) days.    . predniSONE (DELTASONE) 20 MG tablet Take 20 mg by mouth daily with breakfast.     No current facility-administered medications on file prior to visit.     Allergies: No Known Allergies  Review of Systems:  CONSTITUTIONAL: No fevers, chills, night sweats, or weight loss.  EYES: No visual changes or eye pain ENT: No hearing changes.  No history of nose bleeds.   RESPIRATORY: No cough, wheezing and shortness of breath.   CARDIOVASCULAR: Negative for chest pain, and palpitations.   GI: +for abdominal discomfort, blood in stools or black stools.  No recent change in bowel habits.   GU:  No history of incontinence.   MUSCLOSKELETAL: No history of joint pain or swelling.  No myalgias.   SKIN: Negative for lesions, rash, and itching.   ENDOCRINE: Negative for cold or heat intolerance, polydipsia or goiter.   PSYCH:  + depression or anxiety symptoms.   NEURO: As Above.   Vital Signs:  BP 104/62   Pulse 68   Ht 5' 9"  (1.753 m)   Wt 150 lb 4 oz (68.2 kg)   SpO2 97%   BMI 22.19 kg/m    Neurological Exam: MENTAL STATUS including orientation to time, place, person, recent and remote memory, attention span and concentration, language, and fund of knowledge is normal.  Speech is not dysarthric.  CRANIAL NERVES:   No ptosis.  Face is symmetric.   MOTOR:  Motor strength is 5/5 in all extremities, including distally in the toes and hands.  No atrophy, fasciculations or abnormal movements.  No pronator drift.  Tone is normal.    MSRs:  Reflexes are 2+/4 throughout.  SENSORY:  Intact to vibration, pin prick, and temperature throughout.  COORDINATION/GAIT:    Gait narrow based and stable.  Stressed and tandem gait intact.   Data: NCS/EMG of the right side 09/24/2018: This is a normal study of the right side.  In particular, there is no evidence of a large fiber sensorimotor polyneuropathy, or cervical/lumbosacral radiculopathy.  Labs 09/04/2218:  Vitamin B1 41, copper 112, folate >23, SPEP with IFE no M protein  IMPRESSION/PLAN: Paresthesias of the feet >hands in the setting of inflammatory bowel disease, ulcerative colitis.  He reports having [place] flare since February 2020.  However, despite this, his numbness/tingling have significantly improved over the last 6 months and it is reassuring that his NCS/EMG was normal, without evidence of neuropathy.  He remains at risk to develop neuropathy going forward and will monitor for worsening symptoms.  Overall, he is doing well from a neurology standpoint and I will be happy to see him back as needed.  Thank you for allowing me to participate in patient's care.  If I can answer any additional questions, I would be pleased to do so.    Sincerely,    Victoriah Wilds K. Posey Pronto, DO

## 2019-03-30 DIAGNOSIS — K51011 Ulcerative (chronic) pancolitis with rectal bleeding: Secondary | ICD-10-CM | POA: Diagnosis not present

## 2019-04-14 DIAGNOSIS — D500 Iron deficiency anemia secondary to blood loss (chronic): Secondary | ICD-10-CM | POA: Diagnosis not present

## 2019-04-14 DIAGNOSIS — Z0000 Encounter for general adult medical examination without abnormal findings: Secondary | ICD-10-CM | POA: Diagnosis not present

## 2019-04-21 DIAGNOSIS — K5100 Ulcerative (chronic) pancolitis without complications: Secondary | ICD-10-CM | POA: Diagnosis not present

## 2019-04-21 DIAGNOSIS — D500 Iron deficiency anemia secondary to blood loss (chronic): Secondary | ICD-10-CM | POA: Diagnosis not present

## 2019-04-21 DIAGNOSIS — J45909 Unspecified asthma, uncomplicated: Secondary | ICD-10-CM | POA: Diagnosis not present

## 2019-04-21 DIAGNOSIS — G609 Hereditary and idiopathic neuropathy, unspecified: Secondary | ICD-10-CM | POA: Diagnosis not present

## 2019-05-11 DIAGNOSIS — Z79899 Other long term (current) drug therapy: Secondary | ICD-10-CM | POA: Diagnosis not present

## 2019-05-11 DIAGNOSIS — D649 Anemia, unspecified: Secondary | ICD-10-CM | POA: Diagnosis not present

## 2019-05-11 DIAGNOSIS — K51919 Ulcerative colitis, unspecified with unspecified complications: Secondary | ICD-10-CM | POA: Diagnosis not present

## 2019-05-13 ENCOUNTER — Inpatient Hospital Stay (HOSPITAL_COMMUNITY)
Admission: AD | Admit: 2019-05-13 | Discharge: 2019-05-18 | DRG: 386 | Disposition: A | Discharge status: Home / Self Care | Payer: BC Managed Care – PPO | Source: Ambulatory Visit | Attending: Family Medicine | Admitting: Family Medicine

## 2019-05-13 DIAGNOSIS — I959 Hypotension, unspecified: Secondary | ICD-10-CM | POA: Diagnosis present

## 2019-05-13 DIAGNOSIS — Z800 Family history of malignant neoplasm of digestive organs: Secondary | ICD-10-CM | POA: Diagnosis not present

## 2019-05-13 DIAGNOSIS — E860 Dehydration: Secondary | ICD-10-CM | POA: Diagnosis not present

## 2019-05-13 DIAGNOSIS — Z20828 Contact with and (suspected) exposure to other viral communicable diseases: Secondary | ICD-10-CM | POA: Diagnosis present

## 2019-05-13 DIAGNOSIS — D62 Acute posthemorrhagic anemia: Secondary | ICD-10-CM | POA: Diagnosis present

## 2019-05-13 DIAGNOSIS — R001 Bradycardia, unspecified: Secondary | ICD-10-CM | POA: Diagnosis present

## 2019-05-13 DIAGNOSIS — K08409 Partial loss of teeth, unspecified cause, unspecified class: Secondary | ICD-10-CM | POA: Diagnosis not present

## 2019-05-13 DIAGNOSIS — F17290 Nicotine dependence, other tobacco product, uncomplicated: Secondary | ICD-10-CM | POA: Diagnosis not present

## 2019-05-13 DIAGNOSIS — K5180 Other ulcerative colitis without complications: Secondary | ICD-10-CM | POA: Diagnosis not present

## 2019-05-13 DIAGNOSIS — R3129 Other microscopic hematuria: Secondary | ICD-10-CM | POA: Diagnosis not present

## 2019-05-13 DIAGNOSIS — Z79899 Other long term (current) drug therapy: Secondary | ICD-10-CM

## 2019-05-13 DIAGNOSIS — D649 Anemia, unspecified: Secondary | ICD-10-CM | POA: Diagnosis not present

## 2019-05-13 DIAGNOSIS — Z7952 Long term (current) use of systemic steroids: Secondary | ICD-10-CM

## 2019-05-13 DIAGNOSIS — K5190 Ulcerative colitis, unspecified, without complications: Secondary | ICD-10-CM | POA: Diagnosis present

## 2019-05-13 DIAGNOSIS — K51919 Ulcerative colitis, unspecified with unspecified complications: Secondary | ICD-10-CM | POA: Diagnosis not present

## 2019-05-13 DIAGNOSIS — N179 Acute kidney failure, unspecified: Secondary | ICD-10-CM | POA: Diagnosis not present

## 2019-05-13 DIAGNOSIS — E872 Acidosis: Secondary | ICD-10-CM | POA: Diagnosis not present

## 2019-05-13 DIAGNOSIS — D473 Essential (hemorrhagic) thrombocythemia: Secondary | ICD-10-CM | POA: Diagnosis present

## 2019-05-13 DIAGNOSIS — K51011 Ulcerative (chronic) pancolitis with rectal bleeding: Principal | ICD-10-CM | POA: Diagnosis present

## 2019-05-13 LAB — COMPREHENSIVE METABOLIC PANEL
ALT: 9 U/L (ref 0–44)
AST: 11 U/L — ABNORMAL LOW (ref 15–41)
Albumin: 2.5 g/dL — ABNORMAL LOW (ref 3.5–5.0)
Alkaline Phosphatase: 77 U/L (ref 38–126)
Anion gap: 10 (ref 5–15)
BUN: 21 mg/dL — ABNORMAL HIGH (ref 6–20)
CO2: 27 mmol/L (ref 22–32)
Calcium: 8.8 mg/dL — ABNORMAL LOW (ref 8.9–10.3)
Chloride: 99 mmol/L (ref 98–111)
Creatinine, Ser: 1.5 mg/dL — ABNORMAL HIGH (ref 0.61–1.24)
GFR calc Af Amer: 58 mL/min — ABNORMAL LOW (ref >60)
GFR calc non Af Amer: 50 mL/min — ABNORMAL LOW (ref >60)
Glucose, Bld: 129 mg/dL — ABNORMAL HIGH (ref 70–99)
Potassium: 5.1 mmol/L (ref 3.5–5.1)
Sodium: 136 mmol/L (ref 135–145)
Total Bilirubin: 0.4 mg/dL (ref 0.3–1.2)
Total Protein: 6.7 g/dL (ref 6.5–8.1)

## 2019-05-13 LAB — RETICULOCYTES
Immature Retic Fract: 27.3 % — ABNORMAL HIGH (ref 2.3–15.9)
RBC.: 3.07 MIL/uL — ABNORMAL LOW (ref 4.22–5.81)
Retic Count, Absolute: 90 10*3/uL (ref 19.0–186.0)
Retic Ct Pct: 2.9 % (ref 0.4–3.1)

## 2019-05-13 LAB — URINALYSIS, ROUTINE W REFLEX MICROSCOPIC
Bacteria, UA: NONE SEEN
Bilirubin Urine: NEGATIVE
Glucose, UA: NEGATIVE mg/dL
Ketones, ur: NEGATIVE mg/dL
Leukocytes,Ua: NEGATIVE
Nitrite: NEGATIVE
Protein, ur: NEGATIVE mg/dL
Specific Gravity, Urine: 1.014 (ref 1.005–1.030)
pH: 6 (ref 5.0–8.0)

## 2019-05-13 LAB — SODIUM, URINE, RANDOM: Sodium, Ur: 26 mmol/L

## 2019-05-13 LAB — CBC WITH DIFFERENTIAL/PLATELET
Abs Immature Granulocytes: 0.05 10*3/uL (ref 0.00–0.07)
Basophils Absolute: 0 10*3/uL (ref 0.0–0.1)
Basophils Relative: 0 %
Eosinophils Absolute: 0 10*3/uL (ref 0.0–0.5)
Eosinophils Relative: 0 %
HCT: 25.4 % — ABNORMAL LOW (ref 39.0–52.0)
Hemoglobin: 7.5 g/dL — ABNORMAL LOW (ref 13.0–17.0)
Immature Granulocytes: 1 %
Lymphocytes Relative: 8 %
Lymphs Abs: 0.7 10*3/uL (ref 0.7–4.0)
MCH: 24.5 pg — ABNORMAL LOW (ref 26.0–34.0)
MCHC: 29.5 g/dL — ABNORMAL LOW (ref 30.0–36.0)
MCV: 83 fL (ref 80.0–100.0)
Monocytes Absolute: 0.4 10*3/uL (ref 0.1–1.0)
Monocytes Relative: 4 %
Neutro Abs: 7.2 10*3/uL (ref 1.7–7.7)
Neutrophils Relative %: 87 %
Platelets: 466 10*3/uL — ABNORMAL HIGH (ref 150–400)
RBC: 3.06 MIL/uL — ABNORMAL LOW (ref 4.22–5.81)
RDW: 20.8 % — ABNORMAL HIGH (ref 11.5–15.5)
WBC: 8.2 10*3/uL (ref 4.0–10.5)
nRBC: 0 % (ref 0.0–0.2)

## 2019-05-13 LAB — VITAMIN B12: Vitamin B-12: 603 pg/mL (ref 180–914)

## 2019-05-13 LAB — FERRITIN: Ferritin: 19 ng/mL — ABNORMAL LOW (ref 24–336)

## 2019-05-13 LAB — IRON AND TIBC
Iron: <5 ug/dL — ABNORMAL LOW (ref 45–182)
TIBC: 305 ug/dL (ref 250–450)

## 2019-05-13 LAB — CREATININE, URINE, RANDOM: Creatinine, Urine: 137.75 mg/dL

## 2019-05-13 LAB — FOLATE: Folate: 17.2 ng/mL (ref >5.9)

## 2019-05-13 MED ORDER — ACETAMINOPHEN 325 MG PO TABS
650.0000 mg | ORAL_TABLET | ORAL | Status: DC | PRN
  Administered 2019-05-16: 650 mg via ORAL
  Filled 2019-05-13: qty 2

## 2019-05-13 MED ORDER — SODIUM CHLORIDE 0.9 % IV SOLN
5.0000 mg/kg | Freq: Once | INTRAVENOUS | Status: AC
  Administered 2019-05-14: 300 mg via INTRAVENOUS
  Filled 2019-05-13: qty 30

## 2019-05-13 MED ORDER — SODIUM CHLORIDE 0.9 % IV SOLN
INTRAVENOUS | Status: DC
  Administered 2019-05-13 – 2019-05-16 (×5): via INTRAVENOUS

## 2019-05-13 MED ORDER — PANTOPRAZOLE SODIUM 40 MG PO TBEC
40.0000 mg | DELAYED_RELEASE_TABLET | Freq: Every day | ORAL | Status: DC
  Administered 2019-05-14 – 2019-05-18 (×5): 40 mg via ORAL
  Filled 2019-05-13 (×5): qty 1

## 2019-05-13 MED ORDER — METHYLPREDNISOLONE SODIUM SUCC 40 MG IJ SOLR
40.0000 mg | Freq: Three times a day (TID) | INTRAMUSCULAR | Status: DC
  Administered 2019-05-13 – 2019-05-17 (×12): 40 mg via INTRAVENOUS
  Filled 2019-05-13 (×12): qty 1

## 2019-05-13 MED ORDER — ONDANSETRON HCL 4 MG/2ML IJ SOLN
4.0000 mg | Freq: Four times a day (QID) | INTRAMUSCULAR | Status: DC | PRN
  Administered 2019-05-15: 4 mg via INTRAVENOUS
  Filled 2019-05-13: qty 2

## 2019-05-13 NOTE — H&P (Signed)
History and Physical    GENERAL WEARING YQI:[id number] DOB: July 22, 1961 DOA: 05/13/2019  PCP: Merrilee Seashore, MD  Patient coming from: HOME   I have personally briefly reviewed patient's old medical records in [place]  Chief Complaint: direct admission from GI office.   HPI: Zachary Horn is a 58 y.o. male with medical history significant of severe Ulcerative colitis diagnosed last year initially started on azathioprine, without much improvement, he was referred to [facility] GI and he was recommended to get admitted for rescue treatment with IV steroids and remicade , but he preferred to come to [facility] and get admitted for rescue treatment.  His  last sigmoidoscopy was in march 2020,  . He reports to have continued watery bloody stools associated with abdominal cramps, nausea, but without vomiting. He denies any fever or chills, sob,chest pain or cough. He reports feeling dizzy getting out of bed . He reports having syncopal episodes last year when his UC was first diagnosed. No blurry vision or syncope, in the last 6 months.  He denies any headache, dysuria, hematemesis or hemoptysis.  He was admitted to [facility] service and GI consulted . Lab work ordered. covid 19 screening pending.   Review of Systems: As per HPI  "All others reviewed and are negative,"   Past Medical History:  Diagnosis Date  . Ulcerative colitis [facility])     Past Surgical History:  Procedure Laterality Date  . INGUINAL HERNIA REPAIR    . WISDOM TOOTH EXTRACTION     Social History:   reports that he has been smoking cigars. He has never used smokeless tobacco. He reports that he does not drink alcohol or use drugs.  No Known Allergies  Family History  Problem Relation Age of Onset  . Stomach cancer Mother   . AAA (abdominal aortic aneurysm) Father   . Cardiomyopathy Father    Family history reviewed and not pertinent   Prior to Admission medications   Medication Sig Start Date End Date Taking?  Authorizing Provider  Adalimumab (HUMIRA) 40 MG/0.4ML PSKT Inject into the skin every 14 (fourteen) days.    [provider]  predniSONE (DELTASONE) 20 MG tablet Take 20 mg by mouth daily with breakfast.    [provider]    Physical Exam:  Constitutional: NAD, calm, comfortable Vitals:   05/13/19 1630  BP: 100/73  Pulse: 72  Resp: 18  Temp: 98.2 F (36.8 C)  TempSrc: Oral  SpO2: 98%   Eyes: PERRL, pale conjunctiva.  ENMT: Mucous membranes are dry.  Neck: normal, supple,  Respiratory: clear to auscultation bilaterally, no wheezing, . Normal respiratory effort. No accessory muscle use.  Cardiovascular: Regular rate and rhythm, no murmurs  No extremity edema. 2+ pedal pulses. Abdomen: no tenderness, no masses palpated. No hepatosplenomegaly. Bowel sounds positive.  Musculoskeletal: no clubbing / cyanosis. No joint deformity upper and lower extremities.  Skin: no rashes, lesions, ulcers. Neurologic: CN 2-12 grossly intact. Sensation intact,  Psychiatric: Normal judgment and insight. Alert and oriented x 3. Normal mood.     Labs on Admission: I have personally reviewed following labs and imaging studies  CBC: Recent Labs  Lab 05/13/19 1642  WBC 8.2  NEUTROABS 7.2  HGB 7.5*  HCT 25.4*  MCV 83.0  PLT 387*   Basic Metabolic Panel: Recent Labs  Lab 05/13/19 1642  NA 136  K 5.1  CL 99  CO2 27  GLUCOSE 129*  BUN 21*  CREATININE 1.50*  CALCIUM 8.8*  GFR: CrCl cannot be calculated (Unknown ideal weight.). Liver Function Tests: Recent Labs  Lab 05/13/19 1642  AST 11*  ALT 9  ALKPHOS 77  BILITOT 0.4  PROT 6.7  ALBUMIN 2.5*   No results for input(s): LIPASE, AMYLASE in the last 168 hours. No results for input(s): AMMONIA in the last 168 hours. Coagulation Profile: No results for input(s): INR, PROTIME in the last 168 hours. Cardiac Enzymes: No results for input(s): CKTOTAL, CKMB, CKMBINDEX, TROPONINI in the last 168 hours. BNP (last 3  results) No results for input(s): PROBNP in the last 8760 hours. HbA1C: No results for input(s): HGBA1C in the last 72 hours. CBG: No results for input(s): GLUCAP in the last 168 hours. Lipid Profile: No results for input(s): CHOL, HDL, LDLCALC, TRIG, CHOLHDL, LDLDIRECT in the last 72 hours. Thyroid Function Tests: No results for input(s): TSH, T4TOTAL, FREET4, T3FREE, THYROIDAB in the last 72 hours. Anemia Panel: Recent Labs    05/13/19 1642  RETICCTPCT 2.9   Urine analysis:    Component Value Date/Time   COLORURINE YELLOW 04/17/2018 [place] 04/17/2018 1631   LABSPEC 1.003 (L) 04/17/2018 1631   PHURINE 6.0 04/17/2018 1631   GLUCOSEU NEGATIVE 04/17/2018 1631   HGBUR SMALL (A) 04/17/2018 1631   BILIRUBINUR NEGATIVE 04/17/2018 1631   BILIRUBINUR neg 04/03/2013 1104   KETONESUR NEGATIVE 04/17/2018 1631   PROTEINUR NEGATIVE 04/17/2018 1631   UROBILINOGEN 0.2 04/03/2013 1104   NITRITE NEGATIVE 04/17/2018 1631   LEUKOCYTESUR NEGATIVE 04/17/2018 1631    Radiological Exams on Admission: No results found.  EKG: pending.   Assessment/Plan Active Problems:   Ulcerative colitis (HCC)    Severe ulcerative colitis:  - admit for IV steroids.  - added protonix for GI prophylaxis.  Appreciate GI consult and their recommendations.    AKI wit mild NG metabolic acidosis: Probably secondary to dehydration and poor renal perfusion from dehydration and diarrhea. Rule out hydronephrosis.  Get UA, and urine electrolytes. Start the patient on IV fluids and repeat renal parameters in am.     Dizziness:  Probably from dehydration.  Get orthostatic vital signs.     Anemia of blood loss from UC Hemoglobin around 7. Normocytic.  Anemia panel ordered.    Thrombocytosis:  Possibly from hyperactive marrow/ anemia.     DVT prophylaxis: scd's Code Status: full code.  Family Communication: none at bedside.  Disposition Plan: pending further work up.   Consults called: GI Dr Oletta Lamas.  Admission status: inpatient .    Hosie Poisson MD [facility] Pager [phone]  If 7PM-7AM, please contact night-coverage [email or web address] Password [facility]  05/13/2019, 6:59 PM

## 2019-05-13 NOTE — Consult Note (Signed)
[facility] CONS[facility] Reason for consult: Severe ulcerative colitis Referring Physician: [facility].  PCP: Dr Ashby Dawes.  Primary GI: Dr. Marnee Spring is an 58 y.o. male.  HPI: 58 year old white male who has had ulcerative colitis for only about a year.  He was started initially on Entyvio which worked well for some time and then stopped working.  He was initially on azathioprine but had side effects from this and stopped the medication.  He had periodic episodes of bloody diarrhea even while on Entyvio treated with steroids.  His symptoms have become worse over the past 6 months.  3/20 he underwent a flexible sigmoidoscopy showing severe ulcerative colitis.  Since he was no longer responding to [facility] this was discontinued and he was started on Humira.  His initial work-up July/2019 revealed some esophagitis due to Candida on EGD and severe ulcerative pancolitis.  He was checked prior to starting azathioprine and had normal TP MT enzyme activity.  He has had hepatitis B profile that was negative and indeterminate QuantiFERON-TB Gold test.  Because of his lack of response to Entyvio steroids azathioprine he was referred to [facility] for another opinion.  He saw Dr.Jampala at [facility] Tuesday in the GI department who wished to admit him right away for rescue therapy.  The patient preferred to come here to [facility] for admission.  Rescue therapy with IV steroids and Remicade was recommended and if this fails consideration for Xeljanz.  Is also suggested that he be considered for colectomy. The patient has been eating regular food and is having 15-20 bowel movements a day with blood several every night.  Past Medical History:  Diagnosis Date  . Ulcerative colitis [facility])     Past Surgical History:  Procedure Laterality Date  . INGUINAL HERNIA REPAIR    . WISDOM TOOTH EXTRACTION      Family History  Problem Relation Age of Onset  . Stomach cancer Mother   . AAA (abdominal  aortic aneurysm) Father   . Cardiomyopathy Father     Social History:  reports that he has been smoking cigars. He has never used smokeless tobacco. He reports that he does not drink alcohol or use drugs.  Allergies: No Known Allergies  Medications; Prior to Admission medications   Medication Sig Start Date End Date Taking? Authorizing Provider  predniSONE (DELTASONE) 20 MG tablet Take 40 mg by mouth daily with breakfast.    Yes [provider]  Fe Fum-FePoly-Vit C-Vit B3 (INTEGRA) 62.5-62.5-40-3 MG CAPS Take 1 capsule by mouth daily. 04/29/19   [provider]    PRN Meds  Results for orders placed or performed during the hospital encounter of 05/13/19 (from the past 48 hour(s))  CBC with Differential/Platelet     Status: Abnormal   Collection Time: 05/13/19  4:42 PM  Result Value Ref Range   WBC 8.2 4.0 - 10.5 K/uL   RBC 3.06 (L) 4.22 - 5.81 MIL/uL   Hemoglobin 7.5 (L) 13.0 - 17.0 g/dL   HCT 25.4 (L) 39.0 - 52.0 %   MCV 83.0 80.0 - 100.0 fL   MCH 24.5 (L) 26.0 - 34.0 pg   MCHC 29.5 (L) 30.0 - 36.0 g/dL   RDW 20.8 (H) 11.5 - 15.5 %   Platelets 466 (H) 150 - 400 K/uL   nRBC 0.0 0.0 - 0.2 %   Neutrophils Relative % 87 %   Neutro Abs 7.2 1.7 - 7.7 K/uL   Lymphocytes Relative 8 %   Lymphs Abs  0.7 0.7 - 4.0 K/uL   Monocytes Relative 4 %   Monocytes Absolute 0.4 0.1 - 1.0 K/uL   Eosinophils Relative 0 %   Eosinophils Absolute 0.0 0.0 - 0.5 K/uL   Basophils Relative 0 %   Basophils Absolute 0.0 0.0 - 0.1 K/uL   Immature Granulocytes 1 %   Abs Immature Granulocytes 0.05 0.00 - 0.07 K/uL    Comment: Performed at [facility], [place] [address] [place], [place] [address]  Comprehensive metabolic panel     Status: Abnormal   Collection Time: 05/13/19  4:42 PM  Result Value Ref Range   Sodium 136 135 - 145 mmol/L   Potassium 5.1 3.5 - 5.1 mmol/L   Chloride 99 98 - 111 mmol/L   CO2 27 22 - 32 mmol/L   Glucose, Bld 129 (H) 70 - 99 mg/dL   BUN  21 (H) 6 - 20 mg/dL   Creatinine, Ser 1.50 (H) 0.61 - 1.24 mg/dL   Calcium 8.8 (L) 8.9 - 10.3 mg/dL   Total Protein 6.7 6.5 - 8.1 g/dL   Albumin 2.5 (L) 3.5 - 5.0 g/dL   AST 11 (L) 15 - 41 U/L   ALT 9 0 - 44 U/L   Alkaline Phosphatase 77 38 - 126 U/L   Total Bilirubin 0.4 0.3 - 1.2 mg/dL   GFR calc non Af Amer 50 (L) >60 mL/min   GFR calc Af Amer 58 (L) >60 mL/min   Anion gap 10 5 - 15    Comment: Performed at [facility], [place] [address] [place], [place] [address]    No results found.             Blood pressure 100/73, pulse 72, temperature 98.2 F (36.8 C), temperature source Oral, resp. rate 18, SpO2 98 %.  Physical exam:   General--thin white male in no acute distress appears well-nourished ENT--nonicteric Neck--supple Heart--regular rate and rhythm Lungs--clear Abdomen--mild tenderness no guarding rigidity or rebound good bowel sounds Psych--alert and oriented mood appropriate   Assessment: 1.  Severe ulcerative colitis.  Patient has recently been evaluated at [facility] and rescue therapy recommended.  If this does not succeed he may well need colectomy.  All this was discussed in detail with the patient.  Plan: 1.  Per protocol from up-to-date we will start patient on Solu-Medrol 40 mg 3 times daily and infliximab 5 mg/kg at x0, 2 weeks in 6 weeks.  The recommendation is that if he is not responded to this in 3 to 5 days he be given a second dose of infliximab 10 mg/kg.  Should he fail to respond all these things surgical resection of his colon should be considered. 2.  He has been eating solid food but I will go ahead and place him on a clear liquid diet to reduce the residue going to his colon.  We will continue to follow while he is here in the hospital   Nancy Fetter 05/13/2019, 5:10 PM   This note was created using voice recognition software and minor errors may Have occurred unintentionally. Pager: [phone] If no answer or after  hours call [phone]

## 2019-05-14 ENCOUNTER — Other Ambulatory Visit: Payer: Self-pay

## 2019-05-14 ENCOUNTER — Encounter (HOSPITAL_COMMUNITY): Payer: Self-pay

## 2019-05-14 ENCOUNTER — Inpatient Hospital Stay (HOSPITAL_COMMUNITY): Payer: BC Managed Care – PPO

## 2019-05-14 DIAGNOSIS — K51919 Ulcerative colitis, unspecified with unspecified complications: Secondary | ICD-10-CM

## 2019-05-14 LAB — BASIC METABOLIC PANEL
Anion gap: 9 (ref 5–15)
BUN: 16 mg/dL (ref 6–20)
CO2: 25 mmol/L (ref 22–32)
Calcium: 8.6 mg/dL — ABNORMAL LOW (ref 8.9–10.3)
Chloride: 102 mmol/L (ref 98–111)
Creatinine, Ser: 1.22 mg/dL (ref 0.61–1.24)
GFR calc Af Amer: >60 mL/min (ref >60)
GFR calc non Af Amer: >60 mL/min (ref >60)
Glucose, Bld: 147 mg/dL — ABNORMAL HIGH (ref 70–99)
Potassium: 4.6 mmol/L (ref 3.5–5.1)
Sodium: 136 mmol/L (ref 135–145)

## 2019-05-14 LAB — CBC
HCT: 25.3 % — ABNORMAL LOW (ref 39.0–52.0)
Hemoglobin: 7.3 g/dL — ABNORMAL LOW (ref 13.0–17.0)
MCH: 24.2 pg — ABNORMAL LOW (ref 26.0–34.0)
MCHC: 28.9 g/dL — ABNORMAL LOW (ref 30.0–36.0)
MCV: 83.8 fL (ref 80.0–100.0)
Platelets: 409 10*3/uL — ABNORMAL HIGH (ref 150–400)
RBC: 3.02 MIL/uL — ABNORMAL LOW (ref 4.22–5.81)
RDW: 21 % — ABNORMAL HIGH (ref 11.5–15.5)
WBC: 7.3 10*3/uL (ref 4.0–10.5)
nRBC: 0 % (ref 0.0–0.2)

## 2019-05-14 LAB — SARS CORONAVIRUS 2 (TAT 6-24 HRS): SARS Coronavirus 2: NEGATIVE

## 2019-05-14 LAB — HIV ANTIBODY (ROUTINE TESTING W REFLEX): HIV Screen 4th Generation wRfx: NONREACTIVE

## 2019-05-14 NOTE — Progress Notes (Signed)
PROGRESS NOTE    Zachary Horn  DUK:[id number] DOB: 1961-07-11 DOA: 05/13/2019 PCP: Merrilee Seashore, MD   Brief Narrative:  Zachary Horn is Zachary Horn 58 y.o. male with medical history significant of severe Ulcerative colitis diagnosed last year initially started on azathioprine, without much improvement, he was referred to [facility] GI and he was recommended to get admitted for rescue treatment with IV steroids and remicade , but he preferred to come to [facility] and get admitted for rescue treatment.  His  last sigmoidoscopy was in march 2020,  . He reports to have continued watery bloody stools associated with abdominal cramps, nausea, but without vomiting. He denies any fever or chills, sob,chest pain or cough. He reports feeling dizzy getting out of bed . He reports having syncopal episodes last year when his UC was first diagnosed. No blurry vision or syncope, in the last 6 months.  He denies any headache, dysuria, hematemesis or hemoptysis.  He was admitted to [facility] service and GI consulted . Lab work ordered. covid 19 screening pending.   Assessment & Plan:   Active Problems:   Ulcerative colitis ([place])  Severe ulcerative colitis:  - getting rescue therapy with solumedrol and remicade - continue clear liquid diet - continue protonix - appreciate GI recs  AKI wit mild NG metabolic acidosis: Improved today.  Renal US without evidence of hydro.  Urine with microscopic hematuria  Microscopic Hematuria: follow repeat UA outpatient   Dizziness:  Probably from dehydration.  Get orthostatic vital signs (pending).  Continue IVF  Anemia of blood loss from UC  Iron Deficiency Anemia Hemoglobin around 7. Normocytic.  Normal B12, folate.  Low iron and ferritin.  Will plan for IV iron.  Thrombocytosis:  Possibly from hyperactive marrow/ anemia.   DVT prophylaxis: SCD Code Status: full  Family Communication: none at bedside, declines me calling Disposition Plan: pending  improvement   Consultants:   GI  Procedures:   none  Antimicrobials:  Anti-infectives (From admission, onward)   None     Subjective: Feeling better Diarrhea has improved   Objective: Vitals:   05/13/19 2000 05/13/19 2107 05/14/19 0653 05/14/19 1356  BP:  98/64 (!) 89/62 104/76  Pulse:  63 (!) 57 60  Resp:  17 17 16   Temp:  98.2 F (36.8 C) 97.8 F (36.6 C) 98.3 F (36.8 C)  TempSrc:   Oral Oral  SpO2:  100% 99% 99%  Weight: 63.6 kg     Height: 5' 8"  (1.727 m)       Intake/Output Summary (Last 24 hours) at 05/14/2019 1820 Last data filed at 05/14/2019 1815 Gross per 24 hour  Intake 1786.18 ml  Output 360 ml  Net 1426.18 ml   Filed Weights   05/13/19 2000  Weight: 63.6 kg    Examination:  General exam: Appears calm and comfortable  Respiratory system: Clear to auscultation. Respiratory effort normal. Cardiovascular system: S1 & S2 heard, RRR. Gastrointestinal system: Abdomen is nondistended, soft and nontender.  Central nervous system: Alert and oriented. No focal neurological deficits. Extremities: no LEE Skin: No rashes, lesions or ulcers Psychiatry: Judgement and insight appear normal. Mood & affect appropriate.     Data Reviewed: I have personally reviewed following labs and imaging studies  CBC: Recent Labs  Lab 05/13/19 1642 05/14/19 0517  WBC 8.2 7.3  NEUTROABS 7.2  --   HGB 7.5* 7.3*  HCT 25.4* 25.3*  MCV 83.0 83.8  PLT 466* 376*   Basic Metabolic Panel: Recent Labs  Lab 05/13/19 1642 05/14/19 0517  NA 136 136  K 5.1 4.6  CL 99 102  CO2 27 25  GLUCOSE 129* 147*  BUN 21* 16  CREATININE 1.50* 1.22  CALCIUM 8.8* 8.6*   GFR: Estimated Creatinine Clearance: 57.9 mL/min (by C-G formula based on SCr of 1.22 mg/dL). Liver Function Tests: Recent Labs  Lab 05/13/19 1642  AST 11*  ALT 9  ALKPHOS 77  BILITOT 0.4  PROT 6.7  ALBUMIN 2.5*   No results for input(s): LIPASE, AMYLASE in the last 168 hours. No results for  input(s): AMMONIA in the last 168 hours. Coagulation Profile: No results for input(s): INR, PROTIME in the last 168 hours. Cardiac Enzymes: No results for input(s): CKTOTAL, CKMB, CKMBINDEX, TROPONINI in the last 168 hours. BNP (last 3 results) No results for input(s): PROBNP in the last 8760 hours. HbA1C: No results for input(s): HGBA1C in the last 72 hours. CBG: No results for input(s): GLUCAP in the last 168 hours. Lipid Profile: No results for input(s): CHOL, HDL, LDLCALC, TRIG, CHOLHDL, LDLDIRECT in the last 72 hours. Thyroid Function Tests: No results for input(s): TSH, T4TOTAL, FREET4, T3FREE, THYROIDAB in the last 72 hours. Anemia Panel: Recent Labs    05/13/19 1642  VITAMINB12 603  FOLATE 17.2  FERRITIN 19*  TIBC 305  IRON <5*  RETICCTPCT 2.9   Sepsis Labs: No results for input(s): PROCALCITON, LATICACIDVEN in the last 168 hours.  Recent Results (from the past 240 hour(s))  SARS CORONAVIRUS 2 (TAT 6-12 HRS) Nasal Swab Aptima Multi Swab     Status: None   Collection Time: 05/13/19  5:46 PM   Specimen: Aptima Multi Swab; Nasal Swab  Result Value Ref Range Status   SARS Coronavirus 2 NEGATIVE NEGATIVE Final    Comment: (NOTE) SARS-CoV-2 target nucleic acids are NOT DETECTED. The SARS-CoV-2 RNA is generally detectable in upper and lower respiratory specimens during the acute phase of infection. Negative results do not preclude SARS-CoV-2 infection, do not rule out co-infections with other pathogens, and should not be used as the sole basis for treatment or other patient management decisions. Negative results must be combined with clinical observations, patient history, and epidemiological information. The expected result is Negative. Fact Sheet for Patients: SugarRoll.be Fact Sheet for Healthcare Providers: https://www.woods-mathews.com/ This test is not yet approved or cleared by the [place] FDA and  has been  authorized for detection and/or diagnosis of SARS-CoV-2 by FDA under an Emergency Use Authorization (EUA). This EUA will remain  in effect (meaning this test can be used) for the duration of the COVID-19 declaration under Section 56 4(b)(1) of the Act, 21 U.S.C. section 360bbb-3(b)(1), unless the authorization is terminated or revoked sooner. Performed at [facility], [place] [address], [place], [place] [address]          Radiology Studies: US Renal  Result Date: 05/14/2019 CLINICAL DATA:  Acute renal failure. EXAM: RENAL / URINARY TRACT ULTRASOUND COMPLETE COMPARISON:  No prior. FINDINGS: Right Kidney: Renal measurements: 9.8 x 4.4 x 4.0 cm = volume: 90.2 mL . Echogenicity within normal limits. No mass or hydronephrosis visualized. Left Kidney: Renal measurements: 9.5 x 3.5 x 4.3 cm = volume: 72.9 mL. Echogenicity within normal limits. No mass or hydronephrosis visualized. Bladder: Appears normal for degree of bladder distention. IMPRESSION: No acute abnormality identified. Electronically Signed   By: [place]   On: 05/14/2019 08:44        Scheduled Meds: . methylPREDNISolone (SOLU-MEDROL) injection  40 mg  Intravenous TID  . pantoprazole  40 mg Oral Q0600   Continuous Infusions: . sodium chloride 75 mL/hr at 05/14/19 1815     LOS: 1 day    Time spent: over 30 min    Fayrene Helper, MD [facility] Pager AMION  If 7PM-7AM, please contact night-coverage [email or web address] Password [email or web address] 05/14/2019, 6:20 PM

## 2019-05-14 NOTE — Progress Notes (Signed)
[facility] PROGRESS NOTE Subjective Patient has received a couple doses of Solu-Medrol and 1 dose of Remicade.  He just finished the Remicade now.  He did much better overnight only had 1 or 2 stools without blood much less abdominal pain  Objective: Vital signs in last 24 hours: Temp:  [97.8 F (36.6 C)-98.2 F (36.8 C)] 97.8 F (36.6 C) (08/28 0653) Pulse Rate:  [57-72] 57 (08/28 0653) Resp:  [17-18] 17 (08/28 0653) BP: (89-100)/(62-73) 89/62 (08/28 0653) SpO2:  [98 %-100 %] 99 % (08/28 0653) Weight:  [63.6 kg] 63.6 kg (08/27 2000) Last BM Date: 05/13/19  Intake/Output from previous day: 08/27 0701 - 08/28 0700 In: 630 [P.O.:630] Out: -  Intake/Output this shift: Total I/O In: -  Out: 360 [Urine:360]  PE:  Abdomen--soft and nontender  Lab Results: Recent Labs    05/13/19 1642 05/14/19 0517  WBC 8.2 7.3  HGB 7.5* 7.3*  HCT 25.4* 25.3*  PLT 466* 409*   BMET Recent Labs    05/13/19 1642 05/14/19 0517  NA 136 136  K 5.1 4.6  CL 99 102  CO2 27 25  CREATININE 1.50* 1.22   LFT Recent Labs    05/13/19 1642  PROT 6.7  AST 11*  ALT 9  ALKPHOS 77  BILITOT 0.4   PT/INR No results for input(s): LABPROT, INR in the last 72 hours. PANCREAS No results for input(s): LIPASE in the last 72 hours.       Studies/Results: US Renal  Result Date: 05/14/2019 CLINICAL DATA:  Acute renal failure. EXAM: RENAL / URINARY TRACT ULTRASOUND COMPLETE COMPARISON:  No prior. FINDINGS: Right Kidney: Renal measurements: 9.8 x 4.4 x 4.0 cm = volume: 90.2 mL . Echogenicity within normal limits. No mass or hydronephrosis visualized. Left Kidney: Renal measurements: 9.5 x 3.5 x 4.3 cm = volume: 72.9 mL. Echogenicity within normal limits. No mass or hydronephrosis visualized. Bladder: Appears normal for degree of bladder distention. IMPRESSION: No acute abnormality identified. Electronically Signed   By: Marcello Moores  Register   On: 05/14/2019 08:44    Medications: I have  reviewed the patient's current medications.  Assessment:   1.  Severe ulcerative colitis.  Patient on rescue therapy with Solu-Medrol and Remicade IV and clear liquid diet.  Had extensive discussion with him about the need to remain on a low residue diet.   Plan: We will follow protocol as outlined and up-to-date and hopefully will be able to switch him over to oral steroids.  Hopefully will respond to the Remicade. Dr Alessandra Bevels will see over the weekend.   Nancy Fetter 05/14/2019, 11:48 AM  This note was created using voice recognition software. Minor errors may Have occurred unintentionally.  Pager: [phone] If no answer or after hours call [phone]

## 2019-05-15 LAB — COMPREHENSIVE METABOLIC PANEL
ALT: 9 U/L (ref 0–44)
AST: 8 U/L — ABNORMAL LOW (ref 15–41)
Albumin: 2.1 g/dL — ABNORMAL LOW (ref 3.5–5.0)
Alkaline Phosphatase: 57 U/L (ref 38–126)
Anion gap: 5 (ref 5–15)
BUN: 14 mg/dL (ref 6–20)
CO2: 26 mmol/L (ref 22–32)
Calcium: 8.7 mg/dL — ABNORMAL LOW (ref 8.9–10.3)
Chloride: 107 mmol/L (ref 98–111)
Creatinine, Ser: 1.15 mg/dL (ref 0.61–1.24)
GFR calc Af Amer: >60 mL/min (ref >60)
GFR calc non Af Amer: >60 mL/min (ref >60)
Glucose, Bld: 120 mg/dL — ABNORMAL HIGH (ref 70–99)
Potassium: 5.7 mmol/L — ABNORMAL HIGH (ref 3.5–5.1)
Sodium: 138 mmol/L (ref 135–145)
Total Bilirubin: 0.2 mg/dL — ABNORMAL LOW (ref 0.3–1.2)
Total Protein: 5.4 g/dL — ABNORMAL LOW (ref 6.5–8.1)

## 2019-05-15 LAB — CBC
HCT: 23.5 % — ABNORMAL LOW (ref 39.0–52.0)
Hemoglobin: 6.8 g/dL — CL (ref 13.0–17.0)
MCH: 24.4 pg — ABNORMAL LOW (ref 26.0–34.0)
MCHC: 28.9 g/dL — ABNORMAL LOW (ref 30.0–36.0)
MCV: 84.2 fL (ref 80.0–100.0)
Platelets: 427 10*3/uL — ABNORMAL HIGH (ref 150–400)
RBC: 2.79 MIL/uL — ABNORMAL LOW (ref 4.22–5.81)
RDW: 20.8 % — ABNORMAL HIGH (ref 11.5–15.5)
WBC: 9.9 10*3/uL (ref 4.0–10.5)
nRBC: 0 % (ref 0.0–0.2)

## 2019-05-15 LAB — BASIC METABOLIC PANEL
Anion gap: 7 (ref 5–15)
BUN: 15 mg/dL (ref 6–20)
CO2: 25 mmol/L (ref 22–32)
Calcium: 8.5 mg/dL — ABNORMAL LOW (ref 8.9–10.3)
Chloride: 105 mmol/L (ref 98–111)
Creatinine, Ser: 1.04 mg/dL (ref 0.61–1.24)
GFR calc Af Amer: >60 mL/min (ref >60)
GFR calc non Af Amer: >60 mL/min (ref >60)
Glucose, Bld: 110 mg/dL — ABNORMAL HIGH (ref 70–99)
Potassium: 4.6 mmol/L (ref 3.5–5.1)
Sodium: 137 mmol/L (ref 135–145)

## 2019-05-15 LAB — HEMOGLOBIN AND HEMATOCRIT, BLOOD
HCT: 23.8 % — ABNORMAL LOW (ref 39.0–52.0)
Hemoglobin: 7 g/dL — ABNORMAL LOW (ref 13.0–17.0)

## 2019-05-15 LAB — MAGNESIUM: Magnesium: 2.2 mg/dL (ref 1.7–2.4)

## 2019-05-15 LAB — PREPARE RBC (CROSSMATCH)

## 2019-05-15 MED ORDER — SODIUM CHLORIDE 0.9 % IV BOLUS
1000.0000 mL | Freq: Once | INTRAVENOUS | Status: AC
  Administered 2019-05-15: 1000 mL via INTRAVENOUS

## 2019-05-15 MED ORDER — SODIUM CHLORIDE 0.9% IV SOLUTION
Freq: Once | INTRAVENOUS | Status: AC
  Administered 2019-05-15: 15:00:00 via INTRAVENOUS

## 2019-05-15 NOTE — Progress Notes (Signed)
Critical Hgb 6.8 MD notified. Awaiting orders.

## 2019-05-15 NOTE — Progress Notes (Signed)
Ntofied by tele, Patients's HR in 70s sustaining. BP 90/66. MAP 75. Patient sleeping. Patient states asymptomatic. On call notified. Will continue to monitor.

## 2019-05-15 NOTE — Progress Notes (Addendum)
PROGRESS NOTE    Zachary Horn  DQQ:[id number] DOB: 01-Mar-1961 DOA: 05/13/2019 PCP: Merrilee Seashore, MD   Brief Narrative:  Zachary Horn is Zachary Horn 58 y.o. male with medical history significant of severe Ulcerative colitis diagnosed last year initially started on azathioprine, without much improvement, he was referred to [facility] GI and he was recommended to get admitted for rescue treatment with IV steroids and remicade , but he preferred to come to [facility] and get admitted for rescue treatment.  His  last sigmoidoscopy was in march 2020,  . He reports to have continued watery bloody stools associated with abdominal cramps, nausea, but without vomiting. He denies any fever or chills, sob,chest pain or cough. He reports feeling dizzy getting out of bed . He reports having syncopal episodes last year when his UC was first diagnosed. No blurry vision or syncope, in the last 6 months.  He denies any headache, dysuria, hematemesis or hemoptysis.  He was admitted to [facility] service and GI consulted . Lab work ordered. covid 19 screening pending.   Assessment & Plan:   Active Problems:   Ulcerative colitis ([place])  Severe ulcerative colitis:  - getting rescue therapy with solumedrol and remicade - continue clear liquid diet - continue protonix - appreciate GI recs  AKI wit mild NG metabolic acidosis: Improved today.  Renal US without evidence of hydro.  Urine with microscopic hematuria  Microscopic Hematuria: follow repeat UA outpatient   Dizziness:  Probably from dehydration.  Get orthostatic vital signs (negative).  Continue IVF  Anemia of blood loss from UC   Iron Deficiency Anemia Hemoglobin around 7. Normocytic.  Hb 6.8 today, will transfuse 1 unit pRBC Normal B12, folate.  Low iron and ferritin.  Will plan for IV iron prior to discharge  Thrombocytosis:  Possibly from hyperactive marrow/ anemia.   Hypotension: BP soft, MAP appropriate.  Hemodynamically stable.  Continue to  monitor.  DVT prophylaxis: SCD Code Status: full  Family Communication: none at bedside, declines me calling Disposition Plan: pending improvement   Consultants:   GI  Procedures:   none  Antimicrobials:  Anti-infectives (From admission, onward)   None     Subjective: Ate something bad last night, felt like the chicken broth was bad Feels ok overall   Objective: Vitals:   05/15/19 1215 05/15/19 1350 05/15/19 1435 05/15/19 1511  BP: (!) 79/58 (!) 85/57  (!) 86/59  Pulse: (!) 54 (!) 48 (!) 59 (!) 54  Resp: 18 16 18 18   Temp: 98.2 F (36.8 C) 97.9 F (36.6 C)  98 F (36.7 C)  TempSrc: Oral   Oral  SpO2: 100% 100%  100%  Weight:      Height:        Intake/Output Summary (Last 24 hours) at 05/15/2019 1810 Last data filed at 05/15/2019 1200 Gross per 24 hour  Intake 2111.18 ml  Output --  Net 2111.18 ml   Filed Weights   05/13/19 2000  Weight: 63.6 kg    Examination:  General: No acute distress. Cardiovascular: Heart sounds show Rhyann Berton regular rate, and rhythm.  Lungs: Clear to auscultation bilaterally Abdomen: Soft, nontender, nondistended  Neurological: Alert and oriented 3. Moves all extremities 4. Cranial nerves II through XII grossly intact. Skin: Warm and dry. No rashes or lesions. Extremities: No clubbing or cyanosis. No edema.   Data Reviewed: I have personally reviewed following labs and imaging studies  CBC: Recent Labs  Lab 05/13/19 1642 05/14/19 0517 05/15/19 0530 05/15/19 0749  WBC  8.2 7.3 9.9  --   NEUTROABS 7.2  --   --   --   HGB 7.5* 7.3* 6.8* 7.0*  HCT 25.4* 25.3* 23.5* 23.8*  MCV 83.0 83.8 84.2  --   PLT 466* 409* 427*  --    Basic Metabolic Panel: Recent Labs  Lab 05/13/19 1642 05/14/19 0517 05/15/19 0530 05/15/19 0749  NA 136 136 138 137  K 5.1 4.6 5.7* 4.6  CL 99 102 107 105  CO2 27 25 26 25   GLUCOSE 129* 147* 120* 110*  BUN 21* 16 14 15   CREATININE 1.50* 1.22 1.15 1.04  CALCIUM 8.8* 8.6* 8.7* 8.5*  MG  --   --   2.2  --    GFR: Estimated Creatinine Clearance: 67.9 mL/min (by C-G formula based on SCr of 1.04 mg/dL). Liver Function Tests: Recent Labs  Lab 05/13/19 1642 05/15/19 0530  AST 11* 8*  ALT 9 9  ALKPHOS 77 57  BILITOT 0.4 0.2*  PROT 6.7 5.4*  ALBUMIN 2.5* 2.1*   No results for input(s): LIPASE, AMYLASE in the last 168 hours. No results for input(s): AMMONIA in the last 168 hours. Coagulation Profile: No results for input(s): INR, PROTIME in the last 168 hours. Cardiac Enzymes: No results for input(s): CKTOTAL, CKMB, CKMBINDEX, TROPONINI in the last 168 hours. BNP (last 3 results) No results for input(s): PROBNP in the last 8760 hours. HbA1C: No results for input(s): HGBA1C in the last 72 hours. CBG: No results for input(s): GLUCAP in the last 168 hours. Lipid Profile: No results for input(s): CHOL, HDL, LDLCALC, TRIG, CHOLHDL, LDLDIRECT in the last 72 hours. Thyroid Function Tests: No results for input(s): TSH, T4TOTAL, FREET4, T3FREE, THYROIDAB in the last 72 hours. Anemia Panel: Recent Labs    05/13/19 1642  VITAMINB12 603  FOLATE 17.2  FERRITIN 19*  TIBC 305  IRON <5*  RETICCTPCT 2.9   Sepsis Labs: No results for input(s): PROCALCITON, LATICACIDVEN in the last 168 hours.  Recent Results (from the past 240 hour(s))  SARS CORONAVIRUS 2 (TAT 6-12 HRS) Nasal Swab Aptima Multi Swab     Status: None   Collection Time: 05/13/19  5:46 PM   Specimen: Aptima Multi Swab; Nasal Swab  Result Value Ref Range Status   SARS Coronavirus 2 NEGATIVE NEGATIVE Final    Comment: (NOTE) SARS-CoV-2 target nucleic acids are NOT DETECTED. The SARS-CoV-2 RNA is generally detectable in upper and lower respiratory specimens during the acute phase of infection. Negative results do not preclude SARS-CoV-2 infection, do not rule out co-infections with other pathogens, and should not be used as the sole basis for treatment or other patient management decisions. Negative results must be  combined with clinical observations, patient history, and epidemiological information. The expected result is Negative. Fact Sheet for Patients: SugarRoll.be Fact Sheet for Healthcare Providers: https://www.woods-mathews.com/ This test is not yet approved or cleared by the [place] FDA and  has been authorized for detection and/or diagnosis of SARS-CoV-2 by FDA under an Emergency Use Authorization (EUA). This EUA will remain  in effect (meaning this test can be used) for the duration of the COVID-19 declaration under Section 56 4(b)(1) of the Act, 21 U.S.C. section 360bbb-3(b)(1), unless the authorization is terminated or revoked sooner. Performed at [facility], [place] [address], [place], [place] [address]          Radiology Studies: US Renal  Result Date: 05/14/2019 CLINICAL DATA:  Acute renal failure. EXAM: RENAL / URINARY TRACT ULTRASOUND COMPLETE  COMPARISON:  No prior. FINDINGS: Right Kidney: Renal measurements: 9.8 x 4.4 x 4.0 cm = volume: 90.2 mL . Echogenicity within normal limits. No mass or hydronephrosis visualized. Left Kidney: Renal measurements: 9.5 x 3.5 x 4.3 cm = volume: 72.9 mL. Echogenicity within normal limits. No mass or hydronephrosis visualized. Bladder: Appears normal for degree of bladder distention. IMPRESSION: No acute abnormality identified. Electronically Signed   By: Marcello Moores  Register   On: 05/14/2019 08:44        Scheduled Meds:  sodium chloride   Intravenous Once   methylPREDNISolone (SOLU-MEDROL) injection  40 mg Intravenous TID   pantoprazole  40 mg Oral Q0600   Continuous Infusions:  sodium chloride 75 mL/hr at 05/15/19 1718     LOS: 2 days    Time spent: over 30 min    Fayrene Helper, MD [facility] Pager AMION  If 7PM-7AM, please contact night-coverage [email or web address] Password [email or web address] 05/15/2019, 6:10 PM

## 2019-05-15 NOTE — Progress Notes (Signed)
Pt with hemoglobin 7.0 this morning. Receiving one unit PRBC. Has remained with low blood pressure since admission. MD aware.

## 2019-05-15 NOTE — Progress Notes (Signed)
[facility] Gastroenterology Progress Note  Zachary Horn 58 y.o. October 04, 1960  CC: Severe ulcerative colitis   Subjective: Patient was feeling fine until yesterday evening when he ate something that did not agree with him.  Started having nausea.  At 3-4 bowel movement this morning.  Stools are loose and not watery.  Denies seeing blood in it.  ROS : Negative for chest pain shortness of breath   Objective: Vital signs in last 24 hours: Vitals:   05/15/19 0847 05/15/19 1122  BP: 92/63 (!) 83/57  Pulse: 73 (!) 51  Resp: 16 16  Temp:  98 F (36.7 C)  SpO2: 98% 99%    Physical Exam:  General:  Alert, cooperative, no distress, appears stated age  Head:  Normocephalic, without obvious abnormality, atraumatic  Eyes:  , EOM's intact,   Lungs:   Clear to auscultation bilaterally, respirations unlabored  Heart:  Regular rate and rhythm, S1, S2 normal  Abdomen:   Soft, non-tender, bowel sounds active all four quadrants,  no masses,   Extremities: Extremities normal, atraumatic, no  edema  Pulses: 2+ and symmetric    Lab Results: Recent Labs    05/15/19 0530 05/15/19 0749  NA 138 137  K 5.7* 4.6  CL 107 105  CO2 26 25  GLUCOSE 120* 110*  BUN 14 15  CREATININE 1.15 1.04  CALCIUM 8.7* 8.5*  MG 2.2  --    Recent Labs    05/13/19 1642 05/15/19 0530  AST 11* 8*  ALT 9 9  ALKPHOS 77 57  BILITOT 0.4 0.2*  PROT 6.7 5.4*  ALBUMIN 2.5* 2.1*   Recent Labs    05/13/19 1642 05/14/19 0517 05/15/19 0530 05/15/19 0749  WBC 8.2 7.3 9.9  --   NEUTROABS 7.2  --   --   --   HGB 7.5* 7.3* 6.8* 7.0*  HCT 25.4* 25.3* 23.5* 23.8*  MCV 83.0 83.8 84.2  --   PLT 466* 409* 427*  --    No results for input(s): LABPROT, INR in the last 72 hours.    Assessment/Plan: -Severe ulcerative colitis.  Currently on rescue therapy with IV steroids.  Received Remicade 2 days ago. -Chronic blood loss anemia.  Recommendations ------------------------ -Continue IV prednisone for now.  Patient  is requesting to eat scrambled egg.  Okay to advance diet to soft. -Consider adding 6-MP once feeling better.  Developed gastric upset in the past from use of Imuran. Long discussion  with the patient today.  He verbalized understanding. -GI will follow   Otis Brace MD, [place] 05/15/2019, 12:01 PM  Contact #  [phone]

## 2019-05-16 ENCOUNTER — Inpatient Hospital Stay (HOSPITAL_COMMUNITY): Payer: BC Managed Care – PPO

## 2019-05-16 DIAGNOSIS — R001 Bradycardia, unspecified: Secondary | ICD-10-CM

## 2019-05-16 LAB — CBC WITH DIFFERENTIAL/PLATELET
Abs Immature Granulocytes: 0.14 10*3/uL — ABNORMAL HIGH (ref 0.00–0.07)
Basophils Absolute: 0 10*3/uL (ref 0.0–0.1)
Basophils Relative: 0 %
Eosinophils Absolute: 0 10*3/uL (ref 0.0–0.5)
Eosinophils Relative: 0 %
HCT: 25 % — ABNORMAL LOW (ref 39.0–52.0)
Hemoglobin: 7.3 g/dL — ABNORMAL LOW (ref 13.0–17.0)
Immature Granulocytes: 1 %
Lymphocytes Relative: 8 %
Lymphs Abs: 0.9 10*3/uL (ref 0.7–4.0)
MCH: 24.5 pg — ABNORMAL LOW (ref 26.0–34.0)
MCHC: 29.2 g/dL — ABNORMAL LOW (ref 30.0–36.0)
MCV: 83.9 fL (ref 80.0–100.0)
Monocytes Absolute: 0.3 10*3/uL (ref 0.1–1.0)
Monocytes Relative: 3 %
Neutro Abs: 9.2 10*3/uL — ABNORMAL HIGH (ref 1.7–7.7)
Neutrophils Relative %: 88 %
Platelets: 363 10*3/uL (ref 150–400)
RBC: 2.98 MIL/uL — ABNORMAL LOW (ref 4.22–5.81)
RDW: 20.1 % — ABNORMAL HIGH (ref 11.5–15.5)
WBC: 10.5 10*3/uL (ref 4.0–10.5)
nRBC: 0 % (ref 0.0–0.2)

## 2019-05-16 LAB — COMPREHENSIVE METABOLIC PANEL
ALT: 10 U/L (ref 0–44)
AST: 11 U/L — ABNORMAL LOW (ref 15–41)
Albumin: 2.4 g/dL — ABNORMAL LOW (ref 3.5–5.0)
Alkaline Phosphatase: 60 U/L (ref 38–126)
Anion gap: 9 (ref 5–15)
BUN: 16 mg/dL (ref 6–20)
CO2: 24 mmol/L (ref 22–32)
Calcium: 8.7 mg/dL — ABNORMAL LOW (ref 8.9–10.3)
Chloride: 105 mmol/L (ref 98–111)
Creatinine, Ser: 1.15 mg/dL (ref 0.61–1.24)
GFR calc Af Amer: >60 mL/min (ref >60)
GFR calc non Af Amer: >60 mL/min (ref >60)
Glucose, Bld: 147 mg/dL — ABNORMAL HIGH (ref 70–99)
Potassium: 4.5 mmol/L (ref 3.5–5.1)
Sodium: 138 mmol/L (ref 135–145)
Total Bilirubin: 0.4 mg/dL (ref 0.3–1.2)
Total Protein: 6 g/dL — ABNORMAL LOW (ref 6.5–8.1)

## 2019-05-16 LAB — ECHOCARDIOGRAM COMPLETE
Height: 68 in
Weight: 2244.8 oz

## 2019-05-16 LAB — PREPARE RBC (CROSSMATCH)

## 2019-05-16 LAB — MAGNESIUM: Magnesium: 2.2 mg/dL (ref 1.7–2.4)

## 2019-05-16 MED ORDER — SODIUM CHLORIDE 0.9% IV SOLUTION
Freq: Once | INTRAVENOUS | Status: AC
  Administered 2019-05-16: 11:00:00 via INTRAVENOUS

## 2019-05-16 MED ORDER — SODIUM CHLORIDE 0.9 % IV SOLN
510.0000 mg | Freq: Once | INTRAVENOUS | Status: AC
  Administered 2019-05-16: 510 mg via INTRAVENOUS
  Filled 2019-05-16: qty 17

## 2019-05-16 MED ORDER — ALBUMIN HUMAN 25 % IV SOLN
12.5000 g | Freq: Once | INTRAVENOUS | Status: AC
  Administered 2019-05-16: 12.5 g via INTRAVENOUS
  Filled 2019-05-16: qty 50

## 2019-05-16 NOTE — Progress Notes (Signed)
Yellow MEWS. Albumin ordered and complete, No changes in vitals. Rapid nurse informed. Tylenol given for neck pain. Patient asymptomatic. Will page on call again.    Vital Signs MEWS/VS Documentation      05/15/2019 2237 05/16/2019 0231 05/16/2019 0232 05/16/2019 0423   MEWS Score:  3  2  2  2    MEWS Score Color:  Yellow  Yellow  Yellow  Yellow   Resp:  -  -  17  17   Pulse:  -  (!) 42  (!) 42  (!) 42   BP:  -  (!) 82/55  (!) 82/55  (!) 82/56   Temp:  -  98.6 F (37 C)  98.6 F (37 C)  -   O2 Device:  -  -  Room [employer]           Jorene Minors 05/16/2019,4:34 AM

## 2019-05-16 NOTE — Progress Notes (Signed)
Yellow MEWS initiated. Notified provider twice. First order bolus, which is complete. BP and HR still low after bolus. Ordered EKG which is in chart. Patient asymptomatic no complaints. Will notify provider again. See new orders. Will continue to monitor.   Vital Signs MEWS/VS Documentation          Zachary Horn 05/16/2019,2:37 AM MEWS/VS Documentation      05/15/2019 1959 05/15/2019 2024 05/16/2019 0231 05/16/2019 0232   MEWS Score:  1  1  2  2    MEWS Score Color:  Green  Green  Yellow  Yellow   Resp:  -  20  -  17   Pulse:  -  (!) 53  (!) 42  (!) 42   BP:  -  90/66  (!) 82/55  (!) 82/55   Temp:  -  98.1 F (36.7 C)  98.6 F (37 C)  98.6 F (37 C)   O2 Device:  -  Room [employer]  -  Room [employer]

## 2019-05-16 NOTE — Progress Notes (Signed)
PROGRESS NOTE    [name]  QIO:[id number] DOB: Feb 03, 1961 DOA: 05/13/2019 PCP: Zachary Seashore, MD   Brief Narrative:  Zachary Horn is a 58 y.o. male with medical history significant of severe Ulcerative colitis diagnosed last year initially started on azathioprine, without much improvement, he was referred to [facility] GI and he was recommended to get admitted for rescue treatment with IV steroids and remicade , but he preferred to come to [facility] and get admitted for rescue treatment.  His  last sigmoidoscopy was in march 2020,  . He reports to have continued watery bloody stools associated with abdominal cramps, nausea, but without vomiting. He denies any fever or chills, sob,chest pain or cough. He reports feeling dizzy getting out of bed . He reports having syncopal episodes last year when his UC was first diagnosed. No blurry vision or syncope, in the last 6 months.  He denies any headache, dysuria, hematemesis or hemoptysis.  He was admitted to [facility] service and GI consulted . Lab work ordered. covid 19 screening pending.   Assessment & Plan:   Active Problems:   Ulcerative colitis ([place])  Severe ulcerative colitis:  - s/p remicade.  Now getting solumedrol per GI - continue soft diet - continue protonix - appreciate GI recs  Hypotension  Sinus Bradycardia:  BP has been steady in the 80's, this is lower than at admission, but seems consistent with his hospital course at the last hospitalization.  I reviewed hospital notes from last hospitalization in Aug 2019 and he was briefly on pressors and had sinus bradycardia noted then as well (see note from 04/23/2018 - BP was in 90's "at best" with systolic frequently in 32'G.  He was asymptomatic and BP noted to dip when sleeping).  He had sinus bradycardia overnight seen on tele with EKG with sinus brady, similar to priors, does have epsilon wave.   Given he's relatively asymptomatic, will continue to monitor - will plan to d/c tele.   With his anemia, will transfuse another unit of pRBC Follow echocardiogram  AKI wit mild NG metabolic acidosis: Improved today.  Renal US without evidence of hydro.  Urine with microscopic hematuria  Microscopic Hematuria: follow repeat UA outpatient   Dizziness:  Probably from dehydration.  Get orthostatic vital signs (negative).  Continue IVF  Anemia of blood loss from UC  Iron Deficiency Anemia Hemoglobin around 7. Normocytic.  S/p 1 unit pRBC yesterday Will transfuse another unit 8/30 and give IV iron Normal B12, folate.  Low iron and ferritin.   Thrombocytosis:  Possibly from hyperactive marrow/ anemia.   DVT prophylaxis: SCD Code Status: full  Family Communication: none at bedside, declines me calling Disposition Plan: pending improvement   Consultants:   GI  Procedures:   none  Antimicrobials:  Anti-infectives (From admission, onward)   None     Subjective: Hard time sleeping last night Frequent vitals He's very active and works out, BP is normally on the lower side  Objective: Vitals:   05/16/19 1043 05/16/19 1128 05/16/19 1144 05/16/19 1202  BP: (!) 89/63 (!) 81/59    Pulse: (!) 46 (!) 48 (!) 50 (!) 59  Resp: 16 16 18  (!) 22  Temp: 98.3 F (36.8 C) 97.6 F (36.4 C)    TempSrc: Oral Oral    SpO2: 99% 99%    Weight:      Height:        Intake/Output Summary (Last 24 hours) at 05/16/2019 1221 Last data filed at 05/16/2019 1113  Gross per 24 hour  Intake 1652.54 ml  Output -  Net 1652.54 ml   Filed Weights   05/13/19 2000  Weight: 63.6 kg    Examination:  General: No acute distress. Cardiovascular: Heart sounds show a regular rate, and rhythm Lungs: Clear to auscultation bilaterally Abdomen: Soft, nontender, nondistended  Neurological: Alert and oriented 3. Moves all extremities 4. Cranial nerves II through XII grossly intact. Skin: Warm and dry. No rashes or lesions. Extremities: No clubbing or cyanosis. No edema.   Data  Reviewed: I have personally reviewed following labs and imaging studies  CBC: Recent Labs  Lab 05/13/19 1642 05/14/19 0517 05/15/19 0530 05/15/19 0749 05/16/19 0022  WBC 8.2 7.3 9.9  --  10.5  NEUTROABS 7.2  --   --   --  9.2*  HGB 7.5* 7.3* 6.8* 7.0* 7.3*  HCT 25.4* 25.3* 23.5* 23.8* 25.0*  MCV 83.0 83.8 84.2  --  83.9  PLT 466* 409* 427*  --  465   Basic Metabolic Panel: Recent Labs  Lab 05/13/19 1642 05/14/19 0517 05/15/19 0530 05/15/19 0749 05/16/19 0022 05/16/19 0830  NA 136 136 138 137  --  138  K 5.1 4.6 5.7* 4.6  --  4.5  CL 99 102 107 105  --  105  CO2 27 25 26 25   --  24  GLUCOSE 129* 147* 120* 110*  --  147*  BUN 21* 16 14 15   --  16  CREATININE 1.50* 1.22 1.15 1.04  --  1.15  CALCIUM 8.8* 8.6* 8.7* 8.5*  --  8.7*  MG  --   --  2.2  --  2.2  --    GFR: Estimated Creatinine Clearance: 61.4 mL/min (by C-G formula based on SCr of 1.15 mg/dL). Liver Function Tests: Recent Labs  Lab 05/13/19 1642 05/15/19 0530 05/16/19 0830  AST 11* 8* 11*  ALT 9 9 10   ALKPHOS 77 57 60  BILITOT 0.4 0.2* 0.4  PROT 6.7 5.4* 6.0*  ALBUMIN 2.5* 2.1* 2.4*   No results for input(s): LIPASE, AMYLASE in the last 168 hours. No results for input(s): AMMONIA in the last 168 hours. Coagulation Profile: No results for input(s): INR, PROTIME in the last 168 hours. Cardiac Enzymes: No results for input(s): CKTOTAL, CKMB, CKMBINDEX, TROPONINI in the last 168 hours. BNP (last 3 results) No results for input(s): PROBNP in the last 8760 hours. HbA1C: No results for input(s): HGBA1C in the last 72 hours. CBG: No results for input(s): GLUCAP in the last 168 hours. Lipid Profile: No results for input(s): CHOL, HDL, LDLCALC, TRIG, CHOLHDL, LDLDIRECT in the last 72 hours. Thyroid Function Tests: No results for input(s): TSH, T4TOTAL, FREET4, T3FREE, THYROIDAB in the last 72 hours. Anemia Panel: Recent Labs    05/13/19 1642  VITAMINB12 603  FOLATE 17.2  FERRITIN 19*  TIBC 305   IRON <5*  RETICCTPCT 2.9   Sepsis Labs: No results for input(s): PROCALCITON, LATICACIDVEN in the last 168 hours.  Recent Results (from the past 240 hour(s))  SARS CORONAVIRUS 2 (TAT 6-12 HRS) Nasal Swab Aptima Multi Swab     Status: None   Collection Time: 05/13/19  5:46 PM   Specimen: Aptima Multi Swab; Nasal Swab  Result Value Ref Range Status   SARS Coronavirus 2 NEGATIVE NEGATIVE Final    Comment: (NOTE) SARS-CoV-2 target nucleic acids are NOT DETECTED. The SARS-CoV-2 RNA is generally detectable in upper and lower respiratory specimens during the acute phase of infection. Negative results do  not preclude SARS-CoV-2 infection, do not rule out co-infections with other pathogens, and should not be used as the sole basis for treatment or other patient management decisions. Negative results must be combined with clinical observations, patient history, and epidemiological information. The expected result is Negative. Fact Sheet for Patients: SugarRoll.be Fact Sheet for Healthcare Providers: https://www.woods-mathews.com/ This test is not yet approved or cleared by the [place] FDA and  has been authorized for detection and/or diagnosis of SARS-CoV-2 by FDA under an Emergency Use Authorization (EUA). This EUA will remain  in effect (meaning this test can be used) for the duration of the COVID-19 declaration under Section 56 4(b)(1) of the Act, 21 U.S.C. section 360bbb-3(b)(1), unless the authorization is terminated or revoked sooner. Performed at [facility], [place] [address] [place], [place] [address]          Radiology Studies: No results found.      Scheduled Meds: . sodium chloride   Intravenous Once  . methylPREDNISolone (SOLU-MEDROL) injection  40 mg Intravenous TID  . pantoprazole  40 mg Oral Q0600   Continuous Infusions: . sodium chloride 75 mL/hr at 05/16/19 0806  . ferumoxytol       LOS: 3 days     Time spent: over 30 min    Fayrene Helper, MD [facility] Pager AMION  If 7PM-7AM, please contact night-coverage [email or web address] Password [email or web address] 05/16/2019, 12:21 PM

## 2019-05-16 NOTE — Treatment Plan (Signed)
Pt evaluated again this afternoon. Vitals are stable with BP in the 80's and HR in the 50's. This appears to be at baseline. He is feeling well and is asymptomatic from Kristen Fromm HR and BP standpoint. Please do only routine vitals overnight (please do not increase VS frequency for HR or BP if consistent with previous values unless patient is symptomatic).  BP in 80's is acceptable for him if asymptomatic as well as HR in 40's.

## 2019-05-16 NOTE — Progress Notes (Signed)
  Echocardiogram 2D Echocardiogram has been performed.  Zachary Horn 05/16/2019, 11:41 AM

## 2019-05-16 NOTE — Progress Notes (Signed)
[facility] Gastroenterology Progress Note  Zachary Horn 58 y.o. 1961-03-09  CC: Severe ulcerative colitis   Subjective: Yesterday's events noted.  Patient had an episodes of bradycardia and hypotension.  Patient had similar episodes during his admission last year for ulcerative colitis.  Echocardiogram last year was normal.  He is feeling better from GI standpoint.  Only had one bowel movement today.  Denies abdominal pain, nausea or vomiting.  Denies any blood in the stool.  Tolerating soft diet.  ROS : Negative for chest pain shortness of breath   Objective: Vital signs in last 24 hours: Vitals:   05/16/19 0615 05/16/19 0616  BP: (!) 85/62 (!) 80/52  Pulse: (!) 44 (!) 44  Resp:    Temp:    SpO2: 98%     Physical Exam:  General:  Alert, cooperative, no distress, appears stated age  Head:  Normocephalic, without obvious abnormality, atraumatic  Eyes:  , EOM's intact,   Lungs:   Clear to auscultation bilaterally, respirations unlabored  Heart:  Regular rate and rhythm, S1, S2 normal  Abdomen:   Soft, non-tender, bowel sounds active all four quadrants,  no masses,   Extremities: Extremities normal, atraumatic, no  edema  Pulses: 2+ and symmetric    Lab Results: Recent Labs    05/15/19 0530 05/15/19 0749 05/16/19 0022 05/16/19 0830  NA 138 137  --  138  K 5.7* 4.6  --  4.5  CL 107 105  --  105  CO2 26 25  --  24  GLUCOSE 120* 110*  --  147*  BUN 14 15  --  16  CREATININE 1.15 1.04  --  1.15  CALCIUM 8.7* 8.5*  --  8.7*  MG 2.2  --  2.2  --    Recent Labs    05/15/19 0530 05/16/19 0830  AST 8* 11*  ALT 9 10  ALKPHOS 57 60  BILITOT 0.2* 0.4  PROT 5.4* 6.0*  ALBUMIN 2.1* 2.4*   Recent Labs    05/13/19 1642  05/15/19 0530 05/15/19 0749 05/16/19 0022  WBC 8.2   < > 9.9  --  10.5  NEUTROABS 7.2  --   --   --  9.2*  HGB 7.5*   < > 6.8* 7.0* 7.3*  HCT 25.4*   < > 23.5* 23.8* 25.0*  MCV 83.0   < > 84.2  --  83.9  PLT 466*   < > 427*  --  363   < > =  values in this interval not displayed.   No results for input(s): LABPROT, INR in the last 72 hours.    Assessment/Plan: -Severe ulcerative colitis.  Currently on rescue therapy with IV steroids.  Received Remicade 2 days ago. -Chronic blood loss anemia. -Hypotension and bradycardia.  Normal echo last year.  Recommendations ------------------------ -Continue IV prednisone for now (recommend 5 days of rescue treatment.),  Received Remicade 5 mg/kg in May 14, 2019. -He is feeling better.  Tolerating soft diet.  Only had one bowel movement today. -Repeat echo.  -If worsening symptoms, consider another dose of infliximab at 5 to 10 mg/kg probably on Tuesday or Wednesday.  This was discussed with the patient.  He verbalized understanding.     Otis Brace MD, [place] 05/16/2019, 9:59 AM  Contact #  [phone]

## 2019-05-17 LAB — TYPE AND SCREEN
ABO/RH(D): O POS
Antibody Screen: POSITIVE
Donor AG Type: NEGATIVE
Donor AG Type: NEGATIVE
PT AG Type: NEGATIVE
Unit division: 0
Unit division: 0

## 2019-05-17 LAB — CBC
HCT: 28.3 % — ABNORMAL LOW (ref 39.0–52.0)
Hemoglobin: 8.5 g/dL — ABNORMAL LOW (ref 13.0–17.0)
MCH: 25.1 pg — ABNORMAL LOW (ref 26.0–34.0)
MCHC: 30 g/dL (ref 30.0–36.0)
MCV: 83.7 fL (ref 80.0–100.0)
Platelets: 355 10*3/uL (ref 150–400)
RBC: 3.38 MIL/uL — ABNORMAL LOW (ref 4.22–5.81)
RDW: 19.4 % — ABNORMAL HIGH (ref 11.5–15.5)
WBC: 12.3 10*3/uL — ABNORMAL HIGH (ref 4.0–10.5)
nRBC: 0 % (ref 0.0–0.2)

## 2019-05-17 LAB — COMPREHENSIVE METABOLIC PANEL
ALT: 10 U/L (ref 0–44)
AST: 9 U/L — ABNORMAL LOW (ref 15–41)
Albumin: 2.2 g/dL — ABNORMAL LOW (ref 3.5–5.0)
Alkaline Phosphatase: 52 U/L (ref 38–126)
Anion gap: 7 (ref 5–15)
BUN: 16 mg/dL (ref 6–20)
CO2: 25 mmol/L (ref 22–32)
Calcium: 8.4 mg/dL — ABNORMAL LOW (ref 8.9–10.3)
Chloride: 104 mmol/L (ref 98–111)
Creatinine, Ser: 1.18 mg/dL (ref 0.61–1.24)
GFR calc Af Amer: >60 mL/min (ref >60)
GFR calc non Af Amer: >60 mL/min (ref >60)
Glucose, Bld: 103 mg/dL — ABNORMAL HIGH (ref 70–99)
Potassium: 4.1 mmol/L (ref 3.5–5.1)
Sodium: 136 mmol/L (ref 135–145)
Total Bilirubin: 0.8 mg/dL (ref 0.3–1.2)
Total Protein: 5.3 g/dL — ABNORMAL LOW (ref 6.5–8.1)

## 2019-05-17 LAB — BPAM RBC
Blood Product Expiration Date: 202010022359
Blood Product Expiration Date: 202010022359
ISSUE DATE / TIME: 202008291159
ISSUE DATE / TIME: 202008301106
Unit Type and Rh: 5100
Unit Type and Rh: 5100

## 2019-05-17 LAB — MAGNESIUM: Magnesium: 2.2 mg/dL (ref 1.7–2.4)

## 2019-05-17 MED ORDER — PREDNISONE 20 MG PO TABS
20.0000 mg | ORAL_TABLET | Freq: Every day | ORAL | Status: DC
  Administered 2019-05-17: 20 mg via ORAL
  Filled 2019-05-17: qty 1

## 2019-05-17 MED ORDER — PREDNISONE 50 MG PO TABS
60.0000 mg | ORAL_TABLET | Freq: Every day | ORAL | Status: DC
  Administered 2019-05-18: 60 mg via ORAL
  Filled 2019-05-17: qty 1

## 2019-05-17 NOTE — Progress Notes (Signed)
Doing much better.  3 BM's yest, 2 so far today.  No blood in stool for the past several days; no mucus present.  Stool is still soft/lquid in character.  Appropriate post-transfusion rise in hgb.  Pt has been walking in the halls w/out difficulty.  Tolerating soft diet.  No abd pain, except some pre-defecatory discomfort relieved by BM.  EXAM:  Alert, NAD whatsoever.  Abd flat, nontympanitic, nl BS's, nontender. HR nl.  Labs:  Per above.  WBC 12.3 c/w steroids.  Albumin 2.2  IMPR:  Marked improvement, partly due to transfusion, partly due to improved bowel function (? Due to Remicade 3 days ago vs. Steroids)  RECOMM:  1. Transition to prednisone (recomm 60 mg po qAM, 20 mg po qPM)--ordered 2.  Probably ok for dischg tomorrow if doing well 3.  Pt has a f/u appt w/ Dr. Alessandra Bevels for Thursday Sept 3 at 2pm, who will arrange f/u Remicade infusions  Zachary Horn, M.D. Pager [phone] If no answer or after 5 PM call [phone]  Time 30'

## 2019-05-17 NOTE — Progress Notes (Signed)
PROGRESS NOTE    Zachary Horn  ZJI:[id number] DOB: [birth year] DOA: 05/13/2019 PCP: Merrilee Seashore, MD   Brief Narrative:  Zachary Horn is Zachary Horn 58 y.o. male with medical history significant of severe Ulcerative colitis diagnosed last year initially started on azathioprine, without much improvement, he was referred to [facility] GI and he was recommended to get admitted for rescue treatment with IV steroids and remicade , but he preferred to come to [facility] and get admitted for rescue treatment.  His  last sigmoidoscopy was in march 2020,  . He reports to have continued watery bloody stools associated with abdominal cramps, nausea, but without vomiting. He denies any fever or chills, sob,chest pain or cough. He reports feeling dizzy getting out of bed . He reports having syncopal episodes last year when his UC was first diagnosed. No blurry vision or syncope, in the last 6 months.  He denies any headache, dysuria, hematemesis or hemoptysis.  He was admitted to [facility] service and GI consulted . Lab work ordered. covid 19 screening pending.   Assessment & Plan:   Active Problems:   Ulcerative colitis (Zachary Horn)  Severe ulcerative colitis:  - s/p remicade.  Now getting solumedrol per GI - continue soft diet - continue protonix - appreciate GI recs - likely d/c tomorrow on PO steroids - transition to 60 mg po in am and 20 mg in pm  Hypotension  Sinus Bradycardia:  BP has been steady in the 80's, this is lower than at admission, but seems consistent with his hospital course at the last hospitalization.  I reviewed hospital notes from last hospitalization in Aug 2019 and he was briefly on pressors and had sinus bradycardia noted then as well (see note from 04/23/2018 - BP was in 90's "at best" with systolic frequently in 17'P.  He was asymptomatic and BP noted to dip when sleeping).  He had sinus bradycardia overnight seen on tele with EKG with sinus brady, similar to priors, does have epsilon wave.   BP  has improved, continue to monitor Echo with normal EF (see report)  AKI wit mild NG metabolic acidosis: Improved today.  Renal US without evidence of hydro.  Urine with microscopic hematuria  Microscopic Hematuria: follow repeat UA outpatient   Dizziness:  Probably from dehydration.  Get orthostatic vital signs (negative).  Continue IVF  Anemia of blood loss from UC  Iron Deficiency Anemia Hemoglobin around 7. Normocytic.  S/p 2 units pRBC Will transfuse another unit 8/30 and give IV iron Hb improved today Normal B12, folate.  Low iron and ferritin.   Thrombocytosis:  Possibly from hyperactive marrow/ anemia.   DVT prophylaxis: SCD Code Status: full  Family Communication: none at bedside, declines me calling Disposition Plan: pending improvement   Consultants:   GI  Procedures:  Echo IMPRESSIONS    1. The left ventricle has normal systolic function with an ejection fraction of 60-65%. The cavity size was normal. Left ventricular diastolic parameters were normal. No evidence of left ventricular regional wall motion abnormalities.  2. The right ventricle has normal systolic function. The cavity was normal. There is no increase in right ventricular wall thickness.  3. The mitral valve is grossly normal.  4. The tricuspid valve is grossly normal.  5. The aortic valve is tricuspid. No stenosis of the aortic valve.  6. The aorta is normal unless otherwise noted.  7. The inferior vena cava was dilated in size with >50% respiratory variability.  Antimicrobials:  Anti-infectives (From admission, onward)  None     Subjective: Doing better every day Still getting IV steroids  Objective: Vitals:   05/16/19 1422 05/16/19 2044 05/17/19 0547 05/17/19 1400  BP: (!) 89/62 105/75 104/60 104/70  Pulse: (!) 52 63 (!) 53 (!) 49  Resp: 18 19 16 18   Temp: 98.2 F (36.8 C) 98.3 F (36.8 C) 98.2 F (36.8 C) 97.7 F (36.5 C)  TempSrc: Oral Oral Oral Oral  SpO2: 100%  100% 95% 99%  Weight:      Height:        Intake/Output Summary (Last 24 hours) at 05/17/2019 1624 Last data filed at 05/17/2019 1500 Gross per 24 hour  Intake 1549.12 ml  Output 2 ml  Net 1547.12 ml   Filed Weights   05/13/19 2000  Weight: 63.6 kg    Examination:  General: No acute distress. Cardiovascular: Heart sounds show [name] regular rate, and rhythm Lungs: Clear to auscultation bilaterally Abdomen: Soft, nontender, nondistended  Neurological: Alert and oriented 3. Moves all extremities 4. Cranial nerves II through XII grossly intact. Skin: Warm and dry. No rashes or lesions. Extremities: No clubbing or cyanosis. No edema.  Data Reviewed: I have personally reviewed following labs and imaging studies  CBC: Recent Labs  Lab 05/13/19 1642 05/14/19 0517 05/15/19 0530 05/15/19 0749 05/16/19 0022 05/17/19 0555  WBC 8.2 7.3 9.9  --  10.5 12.3*  NEUTROABS 7.2  --   --   --  9.2*  --   HGB 7.5* 7.3* 6.8* 7.0* 7.3* 8.5*  HCT 25.4* 25.3* 23.5* 23.8* 25.0* 28.3*  MCV 83.0 83.8 84.2  --  83.9 83.7  PLT 466* 409* 427*  --  363 250   Basic Metabolic Panel: Recent Labs  Lab 05/14/19 0517 05/15/19 0530 05/15/19 0749 05/16/19 0022 05/16/19 0830 05/17/19 0555  NA 136 138 137  --  138 136  K 4.6 5.7* 4.6  --  4.5 4.1  CL 102 107 105  --  105 104  CO2 25 26 25   --  24 25  GLUCOSE 147* 120* 110*  --  147* 103*  BUN 16 14 15   --  16 16  CREATININE 1.22 1.15 1.04  --  1.15 1.18  CALCIUM 8.6* 8.7* 8.5*  --  8.7* 8.4*  MG  --  2.2  --  2.2  --  2.2   GFR: Estimated Creatinine Clearance: 59.9 mL/min (by C-G formula based on SCr of 1.18 mg/dL). Liver Function Tests: Recent Labs  Lab 05/13/19 1642 05/15/19 0530 05/16/19 0830 05/17/19 0555  AST 11* 8* 11* 9*  ALT 9 9 10 10   ALKPHOS 77 57 60 52  BILITOT 0.4 0.2* 0.4 0.8  PROT 6.7 5.4* 6.0* 5.3*  ALBUMIN 2.5* 2.1* 2.4* 2.2*   No results for input(s): LIPASE, AMYLASE in the last 168 hours. No results for input(s):  AMMONIA in the last 168 hours. Coagulation Profile: No results for input(s): INR, PROTIME in the last 168 hours. Cardiac Enzymes: No results for input(s): CKTOTAL, CKMB, CKMBINDEX, TROPONINI in the last 168 hours. BNP (last 3 results) No results for input(s): PROBNP in the last 8760 hours. HbA1C: No results for input(s): HGBA1C in the last 72 hours. CBG: No results for input(s): GLUCAP in the last 168 hours. Lipid Profile: No results for input(s): CHOL, HDL, LDLCALC, TRIG, CHOLHDL, LDLDIRECT in the last 72 hours. Thyroid Function Tests: No results for input(s): TSH, T4TOTAL, FREET4, T3FREE, THYROIDAB in the last 72 hours. Anemia Panel: No results for input(s): VITAMINB12,  FOLATE, FERRITIN, TIBC, IRON, RETICCTPCT in the last 72 hours. Sepsis Labs: No results for input(s): PROCALCITON, LATICACIDVEN in the last 168 hours.  Recent Results (from the past 240 hour(s))  SARS CORONAVIRUS 2 (TAT 6-12 HRS) Nasal Swab Aptima Multi Swab     Status: None   Collection Time: 05/13/19  5:46 PM   Specimen: Aptima Multi Swab; Nasal Swab  Result Value Ref Range Status   SARS Coronavirus 2 NEGATIVE NEGATIVE Final    Comment: (NOTE) SARS-CoV-2 target nucleic acids are NOT DETECTED. The SARS-CoV-2 RNA is generally detectable in upper and lower respiratory specimens during the acute phase of infection. Negative results do not preclude SARS-CoV-2 infection, do not rule out co-infections with other pathogens, and should not be used as the sole basis for treatment or other patient management decisions. Negative results must be combined with clinical observations, patient history, and epidemiological information. The expected result is Negative. Fact Sheet for Patients: SugarRoll.be Fact Sheet for Healthcare Providers: https://www.woods-mathews.com/ This test is not yet approved or cleared by the [place] FDA and  has been authorized for detection and/or  diagnosis of SARS-CoV-2 by FDA under an Emergency Use Authorization (EUA). This EUA will remain  in effect (meaning this test can be used) for the duration of the COVID-19 declaration under Section 56 4(b)(1) of the Act, 21 U.S.C. section 360bbb-3(b)(1), unless the authorization is terminated or revoked sooner. Performed at [facility], [place] [address], [place], [place] [address]          Radiology Studies: No results found.      Scheduled Meds: . pantoprazole  40 mg Oral Q0600  . predniSONE  20 mg Oral Q1500  . [START ON 05/18/2019] predniSONE  60 mg Oral Q breakfast   Continuous Infusions:    LOS: 4 days    Time spent: over 30 min    Fayrene Helper, MD [facility] Pager AMION  If 7PM-7AM, please contact night-coverage [email or web address] Password [facility] 05/17/2019, 4:24 PM

## 2019-05-18 LAB — COMPREHENSIVE METABOLIC PANEL
ALT: 12 U/L (ref 0–44)
AST: 10 U/L — ABNORMAL LOW (ref 15–41)
Albumin: 2.3 g/dL — ABNORMAL LOW (ref 3.5–5.0)
Alkaline Phosphatase: 53 U/L (ref 38–126)
Anion gap: 6 (ref 5–15)
BUN: 20 mg/dL (ref 6–20)
CO2: 27 mmol/L (ref 22–32)
Calcium: 8.5 mg/dL — ABNORMAL LOW (ref 8.9–10.3)
Chloride: 103 mmol/L (ref 98–111)
Creatinine, Ser: 1.25 mg/dL — ABNORMAL HIGH (ref 0.61–1.24)
GFR calc Af Amer: >60 mL/min (ref >60)
GFR calc non Af Amer: >60 mL/min (ref >60)
Glucose, Bld: 76 mg/dL (ref 70–99)
Potassium: 4.3 mmol/L (ref 3.5–5.1)
Sodium: 136 mmol/L (ref 135–145)
Total Bilirubin: 0.7 mg/dL (ref 0.3–1.2)
Total Protein: 5.5 g/dL — ABNORMAL LOW (ref 6.5–8.1)

## 2019-05-18 LAB — CBC
HCT: 31.1 % — ABNORMAL LOW (ref 39.0–52.0)
Hemoglobin: 9.4 g/dL — ABNORMAL LOW (ref 13.0–17.0)
MCH: 25.6 pg — ABNORMAL LOW (ref 26.0–34.0)
MCHC: 30.2 g/dL (ref 30.0–36.0)
MCV: 84.7 fL (ref 80.0–100.0)
Platelets: 428 10*3/uL — ABNORMAL HIGH (ref 150–400)
RBC: 3.67 MIL/uL — ABNORMAL LOW (ref 4.22–5.81)
RDW: 19.5 % — ABNORMAL HIGH (ref 11.5–15.5)
WBC: 13.4 10*3/uL — ABNORMAL HIGH (ref 4.0–10.5)
nRBC: 0.1 % (ref 0.0–0.2)

## 2019-05-18 LAB — MAGNESIUM: Magnesium: 2.1 mg/dL (ref 1.7–2.4)

## 2019-05-18 MED ORDER — PREDNISONE 10 MG PO TABS: ORAL_TABLET | ORAL | 0 refills | Status: AC

## 2019-05-18 MED ORDER — PANTOPRAZOLE SODIUM 40 MG PO TBEC: 40.0000 mg | DELAYED_RELEASE_TABLET | Freq: Every day | ORAL | 0 refills | Status: AC

## 2019-05-18 NOTE — Progress Notes (Signed)
Continues to do well.  Has been transitioning to oral steroids--last dose of IV SoluMedrol was yesterday morning.  Tolerating solid diet and up and walking in hall w/out difficulty.  2 or 3 soft/loose BM's (without bld) past 24 hrs.  No abd pain. No n/v.  EXAM:  NAD, alert, good spirits.  Abd nondistended, soft, nontender  Labs:  Nl glucose despite high-dose steroids.  Hgb up from 8.5 to 9.4 overnight (?lab variation, also off IVF which might cause some hemoconcentration).  IMPR:  Very satisfactory improvement in UC flare  RECOMM:  --pt feels ready to go home, and I agree.  OK for dischg from GI standpoint on low residue diet and current dose of prednisone (63m qAM, 20 mg qPM).  Would continue iron supplement.  Has office (televisit) f/u w/ Dr. BAlessandra Bevelson Thurs 9/3 at 2 pm already scheduled.  --call if questions  RCleotis Nipper M.D. Pager [phone] no answer or after 5 PM call [phone]

## 2019-05-18 NOTE — Discharge Summary (Signed)
Physician Discharge Summary  Zachary Horn YPP:[id number] DOB: October 20, 1960 DOA: 05/13/2019  PCP: Zachary Seashore, MD  Admit date: 05/13/2019 Discharge date: 05/18/2019  Time spent: 40 minutes  Recommendations for Outpatient Follow-up:  1. Follow outpatient CBC/CMP 2. Follow with GI for UC 3. Pt with microscopic hematuria - follow with PCP outpatient and consider urology f/u  Discharge Diagnoses:  Active Problems:   Ulcerative colitis [name]   Discharge Condition: stable  Diet recommendation: heart healthy  Filed Weights   05/13/19 2000  Weight: 63.6 kg    History of present illness:  Zachary Horn 58 y.o.malewith medical history significant ofsevere Ulcerative colitis diagnosed last year initially started on azathioprine, without much improvement, he was referred to [name] and he was recommended to get admitted for rescue treatment with IV steroids and remicade , but he preferred to come to [name] and get admitted for rescue treatment.  His last sigmoidoscopy was in march 2020, . He reports to have continued watery bloody stools associated with abdominal cramps, nausea, but without vomiting. He denies any fever or chills, sob,chest pain or cough. He reports feeling dizzy getting out of bed . He reports having syncopal episodes last year when his UC was first diagnosed. No blurry vision or syncope, in the last 6 months.  He denies any headache, dysuria, hematemesis or hemoptysis.  He was admitted to [name] and GI consulted . Lab work ordered. covid 19 screening pending.   He was admitted for UC flare.  Received remicade and solumedrol.  He improved and was discharged on PO steroids with plans to f/u with PCP outpatient.  See below for additional details  Hospital Course:  Severe ulcerative colitis:  - s/p remicade.  Now getting solumedrol per GI - continue soft diet - continue protonix - appreciate GI recs - will d/c on prednisone 60 mg in AM and 20 mg in  PM  Hypotension  Sinus Bradycardia:  BP has been steady in the 80's, this is lower than at admission, but seems consistent with his hospital course at the last hospitalization.  I reviewed hospital notes from last hospitalization in Aug 2019 and he was briefly on pressors and had sinus bradycardia noted then as well (see note from 04/23/2018 - BP was in 90's "at best" with systolic frequently in 45'Y.  He was asymptomatic and BP noted to dip when sleeping).  He had sinus bradycardia overnight seen on tele with EKG with sinus brady, similar to priors, does have epsilon wave.   BP has improved, continue to monitor Echo with normal EF (see report)  AKI wit mild NG metabolic acidosis: Improved today.  Renal US without evidence of hydro.  Urine with microscopic hematuria  Microscopic Hematuria: follow repeat UA outpatient, discussed with pt  Dizziness:  Probably from dehydration.  Get orthostatic vital signs (negative).  Continue IVF  Anemia of blood loss from UC  Iron Deficiency Anemia Hemoglobin around 7. Normocytic.  S/p 2 units pRBC Will transfuse another unit 8/30 and give IV iron Hb improved today Normal B12, folate.  Low iron and ferritin.   Thrombocytosis:  Possibly from hyperactive marrow/ anemia.  Procedures:  none  Consultations:  GI  Discharge Exam: Vitals:   05/17/19 2012 05/18/19 0637  BP: 101/74 105/69  Pulse: (!) 51 (!) 44  Resp: 18 17  Temp: 98.4 F (36.9 C) 98.9 F (37.2 C)  SpO2: 98% 95%   Feeling well Ready to go Discussed d/c plan Pt has steroids at  home  General: No acute distress. Cardiovascular: Heart sounds show Zachary Horn regular rate, and rhythm Lungs: Clear to auscultation bilaterally  Abdomen: Soft, nontender, nondistended Neurological: Alert and oriented 3. Moves all extremities 4 . Cranial nerves II through XII grossly intact. Skin: Warm and dry. No rashes or lesions. Extremities: No clubbing or cyanosis. No edema.   Discharge  Instructions   Discharge Instructions    Call MD for:  difficulty breathing, headache or visual disturbances   Complete by: As directed    Call MD for:  extreme fatigue   Complete by: As directed    Call MD for:  hives   Complete by: As directed    Call MD for:  persistant dizziness or light-headedness   Complete by: As directed    Call MD for:  persistant nausea and vomiting   Complete by: As directed    Call MD for:  redness, tenderness, or signs of infection (pain, swelling, redness, odor or green/yellow discharge around incision site)   Complete by: As directed    Call MD for:  severe uncontrolled pain   Complete by: As directed    Call MD for:  temperature >100.4   Complete by: As directed    Diet - low sodium heart healthy   Complete by: As directed    Discharge instructions   Complete by: As directed    You were seen for Carnita Golob crohn's flare.   You've improved after receiving remicade and steroids.   Please continue steroids (60 mg in the morning and 20 mg in the afternoon) and follow up with GI later this week for further recommendations on your steroid dose.  You had microscopic hematuria here (small amount of blood in the urine).  This should be repeated with your primary care doctor as an outpatient.  They may send you to urology.  Return for new, recurrent, or worsening symptoms.  Please ask your PCP to request records from this hospitalization so they know what was done and what the next steps will be.   Increase activity slowly   Complete by: As directed      Allergies as of 05/18/2019   No Known Allergies     Medication List    TAKE these medications   Integra 62.5-62.5-40-3 MG Caps Take 1 capsule by mouth daily.   multivitamin capsule Take 1 capsule by mouth daily.   pantoprazole 40 MG tablet Commonly known as: PROTONIX Take 1 tablet (40 mg total) by mouth daily at 6 (six) AM. Start taking on: May 19, 2019   predniSONE 10 MG tablet Commonly  known as: DELTASONE Take 60 mg (6 pills) each morning and 20 mg (2 pills) each evening.  Follow up with Dr. Alessandra Horn for further recommendations. What changed:   medication strength  how much to take  how to take this  when to take this  additional instructions      No Known Allergies    The results of significant diagnostics from this hospitalization (including imaging, microbiology, ancillary and laboratory) are listed below for reference.    Significant Diagnostic Studies: US Renal  Result Date: 05/14/2019 CLINICAL DATA:  Acute renal failure. EXAM: RENAL / URINARY TRACT ULTRASOUND COMPLETE COMPARISON:  No prior. FINDINGS: Right Kidney: Renal measurements: 9.8 x 4.4 x 4.0 cm = volume: 90.2 mL . Echogenicity within normal limits. No mass or hydronephrosis visualized. Left Kidney: Renal measurements: 9.5 x 3.5 x 4.3 cm = volume: 72.9 mL. Echogenicity within normal limits. No mass or  hydronephrosis visualized. Bladder: Appears normal for degree of bladder distention. IMPRESSION: No acute abnormality identified. Electronically Signed   By: Marcello Moores  Register   On: 05/14/2019 08:44    Microbiology: Recent Results (from the past 240 hour(s))  SARS CORONAVIRUS 2 (TAT 6-12 HRS) Nasal Swab Aptima Multi Swab     Status: None   Collection Time: 05/13/19  5:46 PM   Specimen: Aptima Multi Swab; Nasal Swab  Result Value Ref Range Status   SARS Coronavirus 2 NEGATIVE NEGATIVE Final    Comment: (NOTE) SARS-CoV-2 target nucleic acids are NOT DETECTED. The SARS-CoV-2 RNA is generally detectable in upper and lower respiratory specimens during the acute phase of infection. Negative results do not preclude SARS-CoV-2 infection, do not rule out co-infections with other pathogens, and should not be used as the sole basis for treatment or other patient management decisions. Negative results must be combined with clinical observations, patient history, and epidemiological information. The  expected result is Negative. Fact Sheet for Patients: SugarRoll.be Fact Sheet for Healthcare Providers: https://www.woods-mathews.com/ This test is not yet approved or cleared by the [place] FDA and  has been authorized for detection and/or diagnosis of SARS-CoV-2 by FDA under an Emergency Use Authorization (EUA). This EUA will remain  in effect (meaning this test can be used) for the duration of the COVID-19 declaration under Section 56 4(b)(1) of the Act, 21 U.S.C. section 360bbb-3(b)(1), unless the authorization is terminated or revoked sooner. Performed at [facility], [place] [address] [place], [place] [address]      Labs: Basic Metabolic Panel: Recent Labs  Lab 05/15/19 0530 05/15/19 0749 05/16/19 0022 05/16/19 0830 05/17/19 0555 05/18/19 0622  NA 138 137  --  138 136 136  K 5.7* 4.6  --  4.5 4.1 4.3  CL 107 105  --  105 104 103  CO2 26 25  --  24 25 27   GLUCOSE 120* 110*  --  147* 103* 76  BUN 14 15  --  16 16 20   CREATININE 1.15 1.04  --  1.15 1.18 1.25*  CALCIUM 8.7* 8.5*  --  8.7* 8.4* 8.5*  MG 2.2  --  2.2  --  2.2 2.1   Liver Function Tests: Recent Labs  Lab 05/13/19 1642 05/15/19 0530 05/16/19 0830 05/17/19 0555 05/18/19 0622  AST 11* 8* 11* 9* 10*  ALT 9 9 10 10 12   ALKPHOS 77 57 60 52 53  BILITOT 0.4 0.2* 0.4 0.8 0.7  PROT 6.7 5.4* 6.0* 5.3* 5.5*  ALBUMIN 2.5* 2.1* 2.4* 2.2* 2.3*   No results for input(s): LIPASE, AMYLASE in the last 168 hours. No results for input(s): AMMONIA in the last 168 hours. CBC: Recent Labs  Lab 05/13/19 1642 05/14/19 0517 05/15/19 0530 05/15/19 0749 05/16/19 0022 05/17/19 0555 05/18/19 0622  WBC 8.2 7.3 9.9  --  10.5 12.3* 13.4*  NEUTROABS 7.2  --   --   --  9.2*  --   --   HGB 7.5* 7.3* 6.8* 7.0* 7.3* 8.5* 9.4*  HCT 25.4* 25.3* 23.5* 23.8* 25.0* 28.3* 31.1*  MCV 83.0 83.8 84.2  --  83.9 83.7 84.7  PLT 466* 409* 427*  --  363 355 428*   Cardiac  Enzymes: No results for input(s): CKTOTAL, CKMB, CKMBINDEX, TROPONINI in the last 168 hours. BNP: BNP (last 3 results) No results for input(s): BNP in the last 8760 hours.  ProBNP (last 3 results) No results for input(s): PROBNP in the last 8760 hours.  CBG: No results for input(s): GLUCAP in the last 168 hours.     Signed:  Fayrene Helper MD.  [facility] 05/18/2019, 9:46 AM

## 2019-05-18 NOTE — Plan of Care (Signed)

## 2019-05-18 NOTE — Progress Notes (Signed)
Zachary Horn to be D/C'd Home per MD order.  Discussed prescriptions and follow up appointments with the patient. Prescriptions given to patient, medication list explained in detail. Pt verbalized understanding.  Allergies as of 05/18/2019   No Known Allergies     Medication List    TAKE these medications   Integra 62.5-62.5-40-3 MG Caps Take 1 capsule by mouth daily.   multivitamin capsule Take 1 capsule by mouth daily.   pantoprazole 40 MG tablet Commonly known as: PROTONIX Take 1 tablet (40 mg total) by mouth daily at 6 (six) AM. Start taking on: May 19, 2019   predniSONE 10 MG tablet Commonly known as: DELTASONE Take 60 mg (6 pills) each morning and 20 mg (2 pills) each evening.  Follow up with Dr. Alessandra Bevels for further recommendations. What changed:   medication strength  how much to take  how to take this  when to take this  additional instructions       Vitals:   05/17/19 2012 05/18/19 0637  BP: 101/74 105/69  Pulse: (!) 51 (!) 44  Resp: 18 17  Temp: 98.4 F (36.9 C) 98.9 F (37.2 C)  SpO2: 98% 95%    Skin clean, dry and intact without evidence of skin break down, no evidence of skin tears noted. IV catheter discontinued intact. Site without signs and symptoms of complications. Dressing and pressure applied. Pt denies pain at this time. No complaints noted.  An After Visit Summary was printed and given to the patient. Patient escorted via [place], and D/C home via private auto.  Nonie Hoyer S 05/18/2019 10:02 AM

## 2019-05-20 DIAGNOSIS — K51011 Ulcerative (chronic) pancolitis with rectal bleeding: Secondary | ICD-10-CM | POA: Diagnosis not present

## 2019-05-26 DIAGNOSIS — K5100 Ulcerative (chronic) pancolitis without complications: Secondary | ICD-10-CM | POA: Diagnosis not present

## 2019-06-01 DIAGNOSIS — K5100 Ulcerative (chronic) pancolitis without complications: Secondary | ICD-10-CM | POA: Diagnosis not present

## 2019-06-02 DIAGNOSIS — K51011 Ulcerative (chronic) pancolitis with rectal bleeding: Secondary | ICD-10-CM | POA: Diagnosis not present

## 2019-06-04 DIAGNOSIS — Z7189 Other specified counseling: Secondary | ICD-10-CM | POA: Diagnosis not present

## 2019-06-04 DIAGNOSIS — Z23 Encounter for immunization: Secondary | ICD-10-CM | POA: Diagnosis not present

## 2019-06-04 DIAGNOSIS — K5100 Ulcerative (chronic) pancolitis without complications: Secondary | ICD-10-CM | POA: Diagnosis not present

## 2019-06-29 DIAGNOSIS — K5100 Ulcerative (chronic) pancolitis without complications: Secondary | ICD-10-CM | POA: Diagnosis not present

## 2019-07-08 DIAGNOSIS — K51011 Ulcerative (chronic) pancolitis with rectal bleeding: Secondary | ICD-10-CM | POA: Diagnosis not present

## 2019-07-12 DIAGNOSIS — K51011 Ulcerative (chronic) pancolitis with rectal bleeding: Secondary | ICD-10-CM | POA: Diagnosis not present

## 2019-08-17 DIAGNOSIS — Z87891 Personal history of nicotine dependence: Secondary | ICD-10-CM | POA: Diagnosis not present

## 2019-08-17 DIAGNOSIS — K5190 Ulcerative colitis, unspecified, without complications: Secondary | ICD-10-CM | POA: Diagnosis not present

## 2019-08-23 DIAGNOSIS — Z23 Encounter for immunization: Secondary | ICD-10-CM | POA: Diagnosis not present

## 2019-08-24 DIAGNOSIS — K5100 Ulcerative (chronic) pancolitis without complications: Secondary | ICD-10-CM | POA: Diagnosis not present

## 2019-09-21 IMAGING — DX DG CHEST 1V PORT
1 series · 1 of 1 positions shown · non-contrast
Comparison: 03/30/2018.

CLINICAL DATA: Shortness of breath.  Lightheaded.

EXAM:
PORTABLE CHEST 1 VIEW

[chest ap]
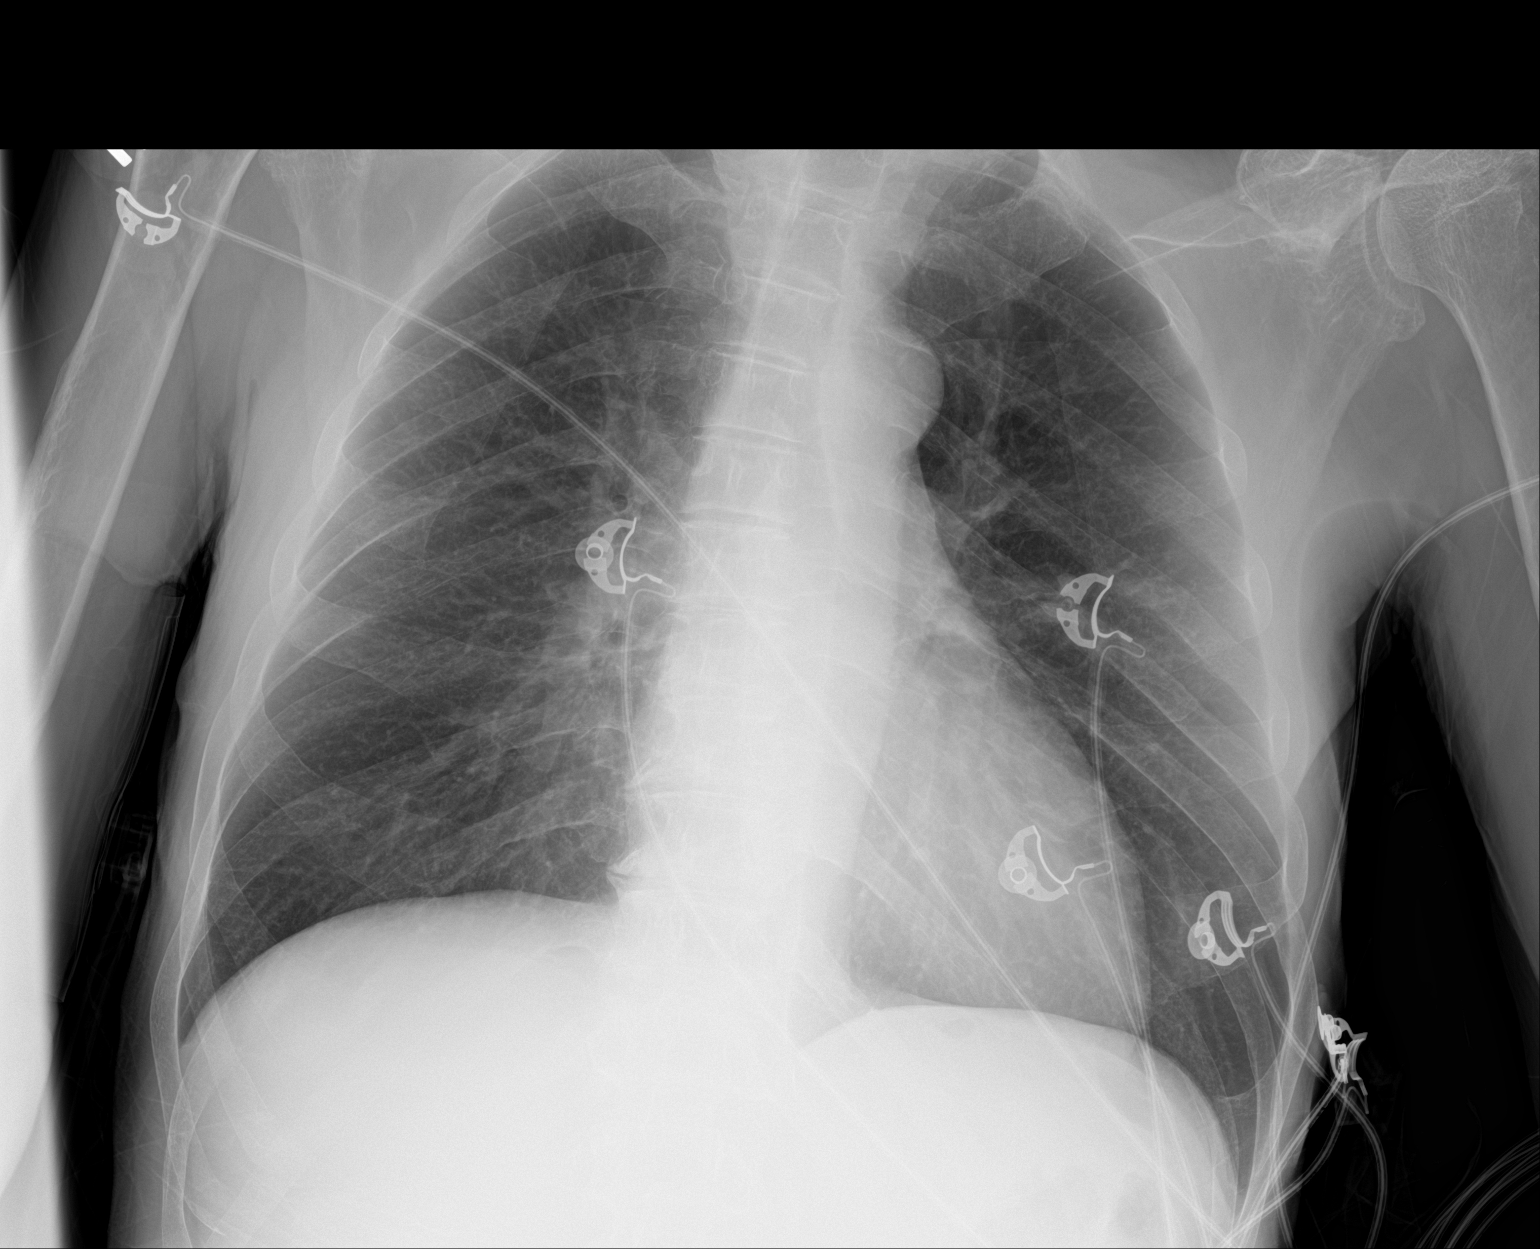

[1 of 1 positions shown; findings below may reference images not displayed]

FINDINGS: Normal sized heart. Clear lungs with normal vascularity. Diffuse
osteopenia and lower thoracic spine degenerative changes.
IMPRESSION: No acute abnormality.

## 2020-10-17 IMAGING — US US RENAL
1 series · 14 of 25 positions shown · non-contrast
Comparison: No prior.

CLINICAL DATA: Acute renal failure.

EXAM:
RENAL / URINARY TRACT ULTRASOUND COMPLETE

[Series 1: us renal · 14 of 27 slices shown]
[im 1/27]
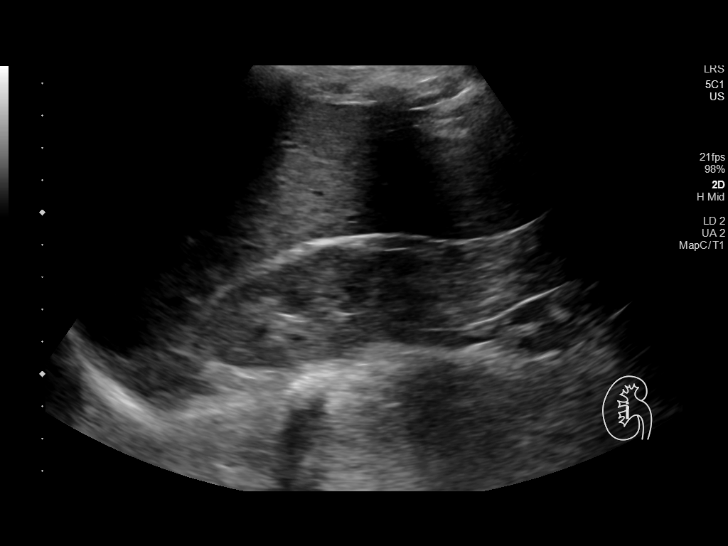
[im 3/27]
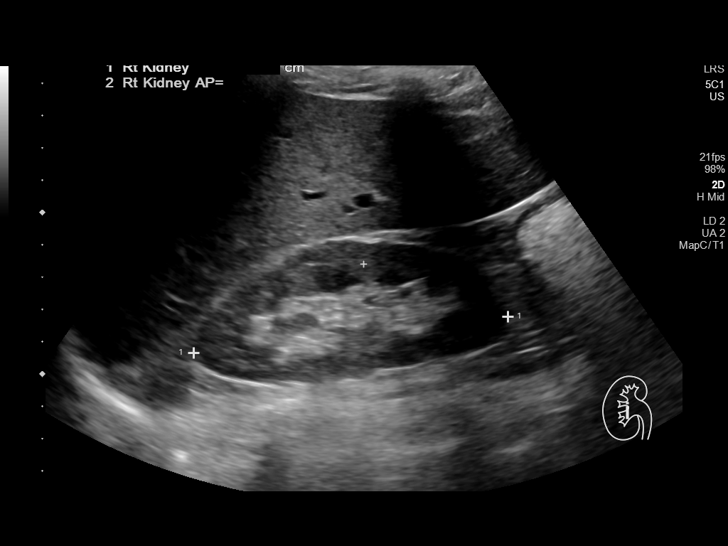
[im 5/27]
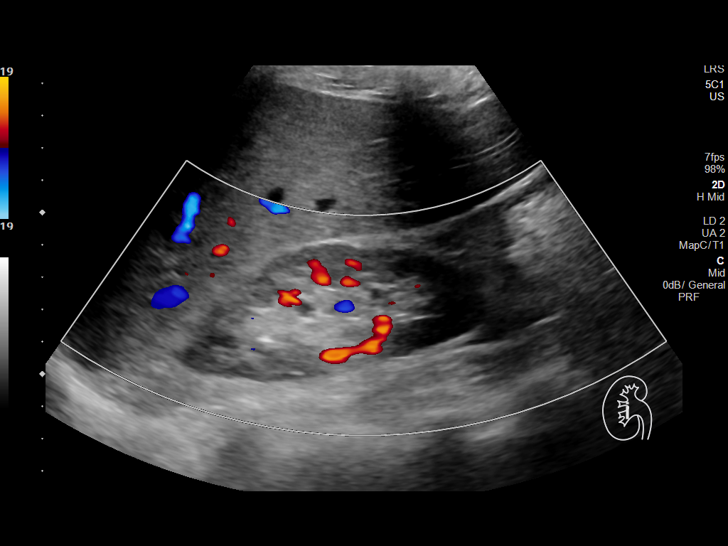
[im 7/27]
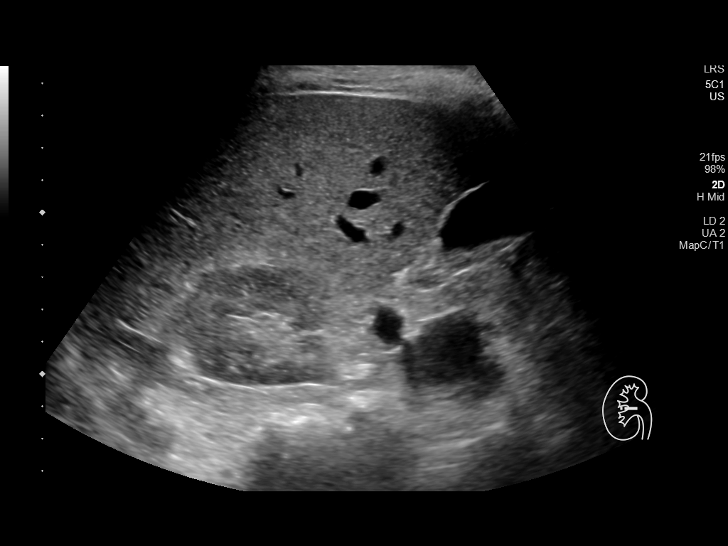
[im 9/27]
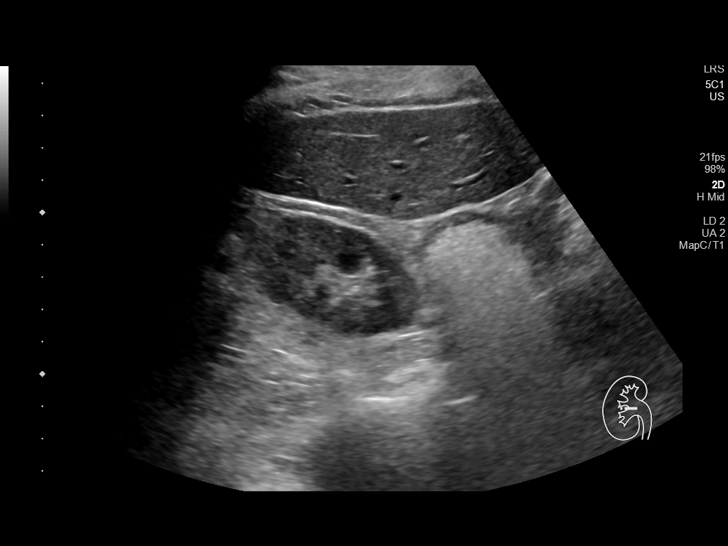
[im 10/27]
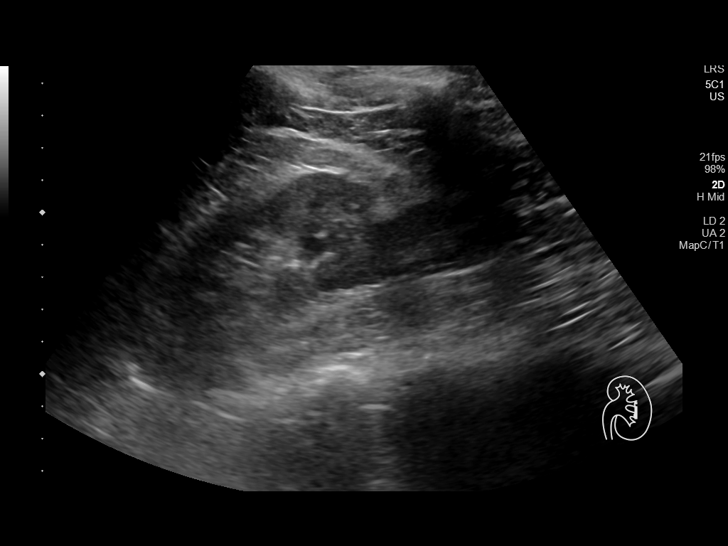
[im 12/27]
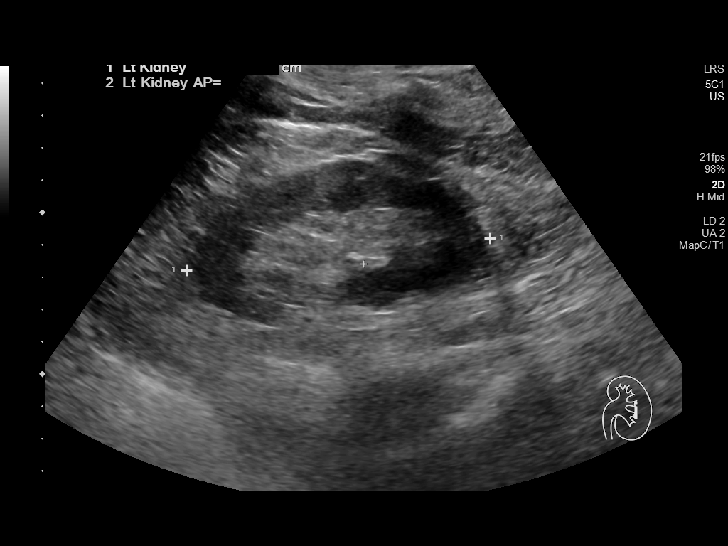
[im 15/27]
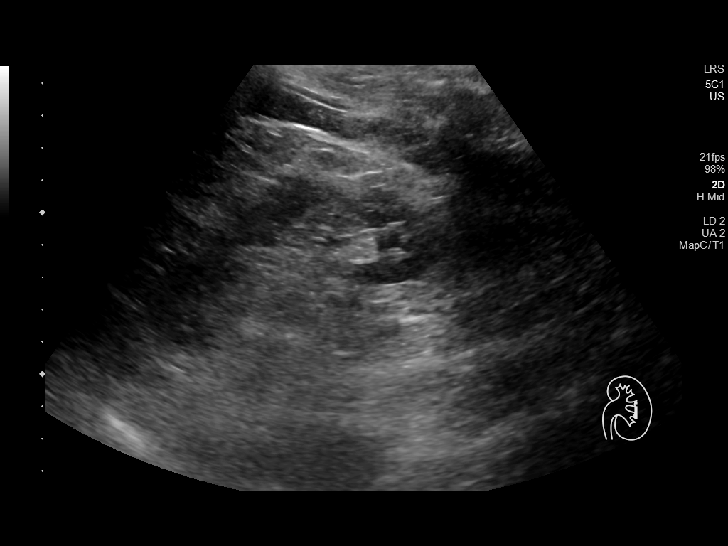
[im 17/27]
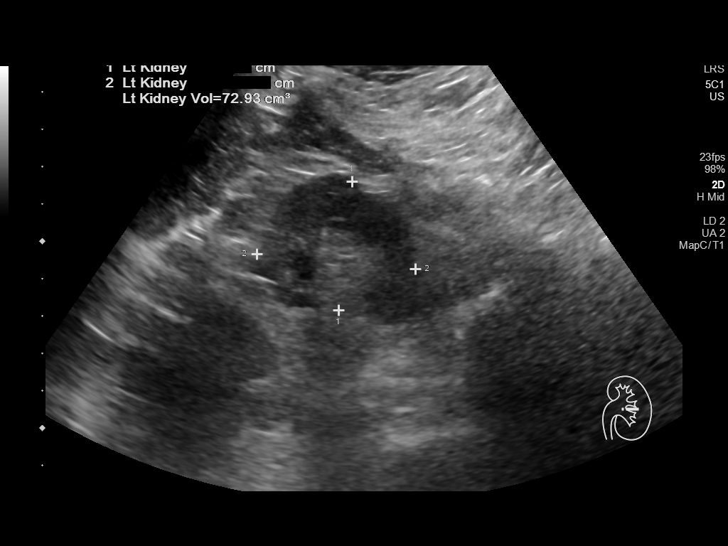
[im 18/27]
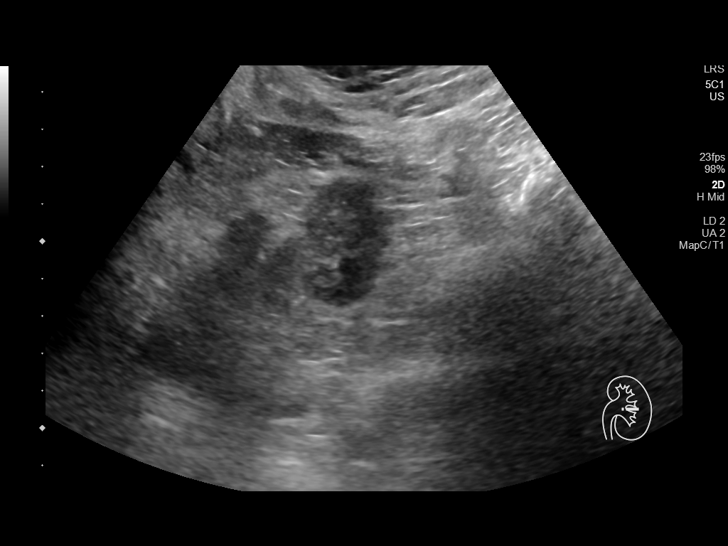
[im 20/27]
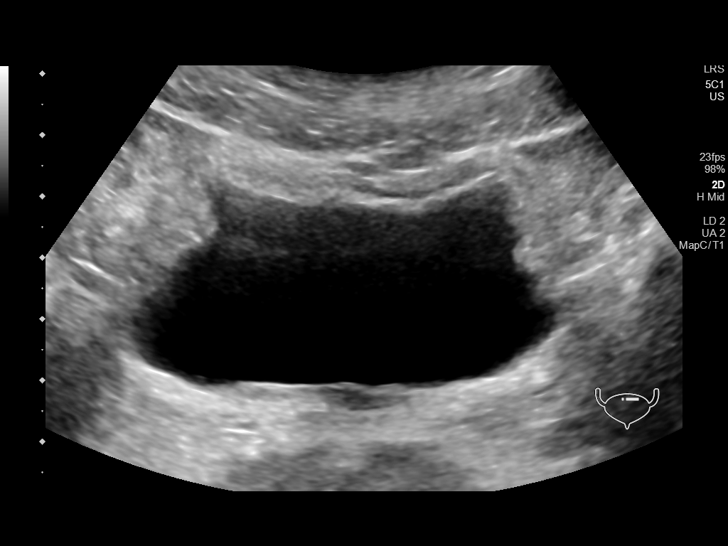
[im 22/27]
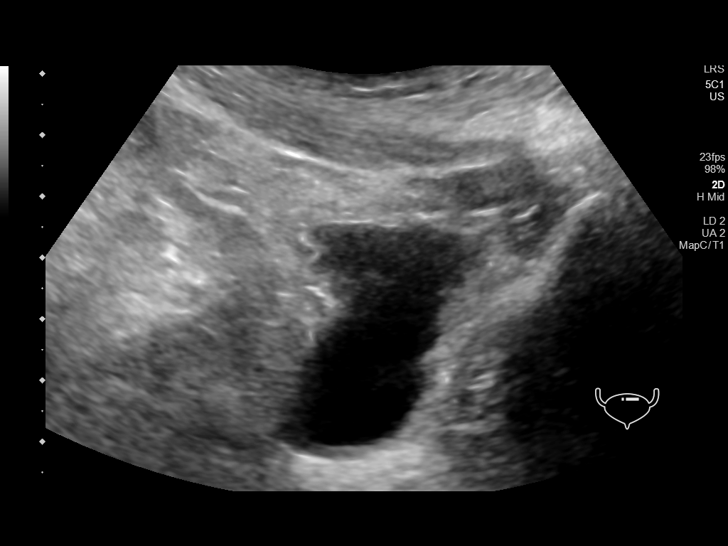
[im 24/27]
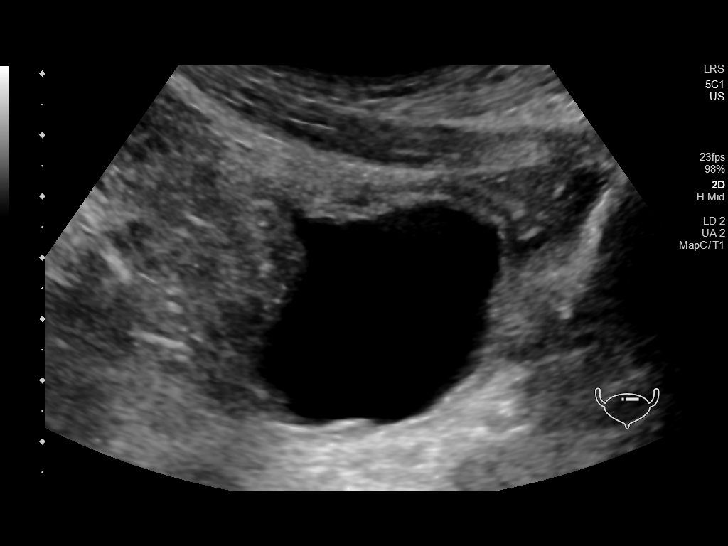
[im 27/27]
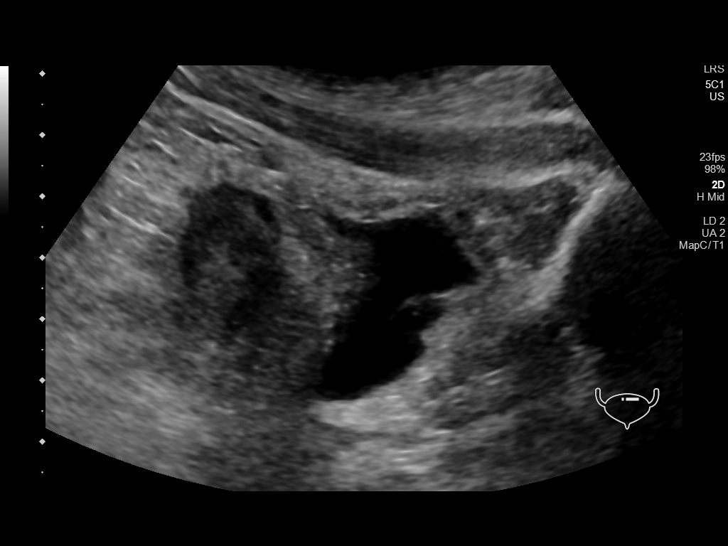

[14 of 25 positions shown; findings below may reference images not displayed]

FINDINGS: Right Kidney:

Renal measurements: 9.8 x 4.4 x 4.0 cm = volume: 90.2 mL .
Echogenicity within normal limits. No mass or hydronephrosis
visualized.

Left Kidney:

Renal measurements: 9.5 x 3.5 x 4.3 cm = volume: 72.9 mL.
Echogenicity within normal limits. No mass or hydronephrosis
visualized.

Bladder:

Appears normal for degree of bladder distention.
IMPRESSION: No acute abnormality identified.

## 2020-12-08 ENCOUNTER — Ambulatory Visit (INDEPENDENT_AMBULATORY_CARE_PROVIDER_SITE_OTHER): Payer: 59 | Admitting: Podiatry

## 2020-12-08 ENCOUNTER — Other Ambulatory Visit: Payer: Self-pay

## 2020-12-08 DIAGNOSIS — S9031XA Contusion of right foot, initial encounter: Secondary | ICD-10-CM | POA: Diagnosis not present

## 2020-12-08 DIAGNOSIS — J45909 Unspecified asthma, uncomplicated: Secondary | ICD-10-CM | POA: Insufficient documentation

## 2020-12-08 DIAGNOSIS — E782 Mixed hyperlipidemia: Secondary | ICD-10-CM | POA: Insufficient documentation

## 2020-12-08 DIAGNOSIS — G609 Hereditary and idiopathic neuropathy, unspecified: Secondary | ICD-10-CM | POA: Insufficient documentation

## 2020-12-08 DIAGNOSIS — Z0000 Encounter for general adult medical examination without abnormal findings: Secondary | ICD-10-CM | POA: Insufficient documentation

## 2020-12-08 DIAGNOSIS — K5100 Ulcerative (chronic) pancolitis without complications: Secondary | ICD-10-CM | POA: Insufficient documentation

## 2020-12-08 DIAGNOSIS — R7402 Elevation of levels of lactic acid dehydrogenase (LDH): Secondary | ICD-10-CM | POA: Insufficient documentation

## 2020-12-08 DIAGNOSIS — M2550 Pain in unspecified joint: Secondary | ICD-10-CM | POA: Insufficient documentation

## 2020-12-08 DIAGNOSIS — R319 Hematuria, unspecified: Secondary | ICD-10-CM | POA: Insufficient documentation

## 2020-12-08 DIAGNOSIS — M359 Systemic involvement of connective tissue, unspecified: Secondary | ICD-10-CM | POA: Insufficient documentation

## 2020-12-08 DIAGNOSIS — W2104XA Struck by golf ball, initial encounter: Secondary | ICD-10-CM

## 2020-12-08 DIAGNOSIS — M1990 Unspecified osteoarthritis, unspecified site: Secondary | ICD-10-CM | POA: Insufficient documentation
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
## 2022-10-15 ENCOUNTER — Ambulatory Visit (INDEPENDENT_AMBULATORY_CARE_PROVIDER_SITE_OTHER): Payer: Commercial Managed Care - HMO | Admitting: Sports Medicine

## 2022-10-15 VITALS — BP 102/72 | Ht 68.0 in | Wt 160.0 lb

## 2022-10-15 DIAGNOSIS — M25562 Pain in left knee: Secondary | ICD-10-CM | POA: Diagnosis not present

## 2022-10-15 NOTE — Progress Notes (Signed)
PCP: Georgianne Fick, MD  Subjective:   CC: Left knee pain  HPI: Patient is a 62 y.o. male here for evaluation of L knee pain.  Patient was golfing on Christmas and felt sudden knee discomfort while bending down to pick up a ball. He reduced activity for the remainder of the week, but found himself unable to run when restarting activity and has stopped running the past few weeks. Since then he has continued to do some other physical activity with yoga, avoiding activities that cause knee pain. Pain/discomfort is characterized as "tight" feeling below the front of the knee and tightness in the lower hamstrings. States that his physio noted swelling in the knee at the time. He has been taking ibuprofen 400-800mg  occasionally, which he has been tolerating despite h/o UC. Denies any issues below the knee or at the hip.      Objective:  Physical Exam:  Gen: NAD, comfortable in exam room  L Knee: No gross deformity compared to R knee. Slight tenderness to lateral and posterior knee joint. Full active and passive ROM. 5/5 strength flex/ext. Neg drawer tests, stable under valgus/varus stress. Negative Thessaly's.   [employer] L Knee 10/15/22: - Suprapatellar pouch with obvious effusion - No Baker cyst in posterior knee - Lateral joint w/ bone spurring - Lateral meniscus unremarkable - Medial meniscus w/ fluid and bulging ---Findings above are suggestive of medial meniscus injury   Assessment & Plan:  1. L Knee pain: limited US showed signs of arthritic changes as well as a likely medial meniscus injury. Mechanism as stated unlikely to cause hamstring injury. Knee is otherwise stable, low concern for any other ligament injuries.  - Ordered L knee XR to further characterize potential OA - Provided home exercises for strengthening knee - Provided compression knee sleeve to use with exercises - Can continue NSAIDs as tolerated, avoid increasing for now in setting of UC. Also recommended  continuing other conservative therapies for pain such as ice, compresses - f/u in 3-4 weeks  Paulino Door, [facility] of Medicine  Patient seen and evaluated with the medical student.  I agree with the above plan of care.  Ultrasound today does show knee effusion as well as some mild degenerative changes along the lateral knee.  There is some extrusion of the medial meniscus which could suggest an occult meniscal tear.  We will begin with treatment as above and the patient will follow-up with me in 3 to 4 weeks.  We will get an x-ray in the meantime.  If no improvement at follow-up then consider merits of cortisone injection versus further diagnostic imaging.  Mamma call with questions or concerns in the interim.  This note was dictated using Dragon naturally speaking software and may contain errors in syntax, spelling, or content which have not been identified prior to signing this note.

## 2022-10-17 ENCOUNTER — Ambulatory Visit
Admission: RE | Admit: 2022-10-17 | Discharge: 2022-10-17 | Disposition: A | Discharge status: Home / Self Care | Payer: Commercial Managed Care - HMO | Source: Ambulatory Visit | Attending: Sports Medicine | Admitting: Sports Medicine

## 2022-10-17 DIAGNOSIS — M25562 Pain in left knee: Secondary | ICD-10-CM

## 2022-10-21 ENCOUNTER — Encounter: Payer: Self-pay | Admitting: Sports Medicine

## 2022-11-05 ENCOUNTER — Ambulatory Visit: Payer: Commercial Managed Care - HMO | Admitting: Sports Medicine

## 2022-11-05 VITALS — BP 106/71 | Ht 69.0 in | Wt 160.0 lb

## 2022-11-05 DIAGNOSIS — M25562 Pain in left knee: Secondary | ICD-10-CM

## 2022-11-05 NOTE — Progress Notes (Unsigned)
  Zachary Horn - 62 y.o. male MRN [id number]  Date of birth: [birth year]  SUBJECTIVE:  Including CC & ROS.  CC: Follow up of left knee pain  HPI: Patient is presenting for follow up of left knee pain. At last visit 1/30, ultrasound was suggestive of medial meniscus injury. XR showed patellar bone spurring, otherwise no arthritis. Patient feels his knee pain has improved, sometimes forgetting it was an issue at all. Notes the discomfort around the sides of his knee only occasionally such as when putting on his shoes. Has been doing other physical activity such as golfing, walking, and yoga, but has not gotten into running again. Did not do knee exercises. Occasionally wears knee sleeve at work. No new trauma to knee. Denies anterior knee pain or weakness.    HISTORY: Past Medical, Surgical, Social, and Family History Reviewed & Updated per EMR.   Pertinent Historical Findings include: none  DATA REVIEWED: L Knee XR 3 view 10/2022: Enthesopathic changes off the superior patella. No fracture, dislocation, or significant joint effusion.  PHYSICAL EXAM:  VS: BP:106/71  HR: bpm  TEMP: ( )  RESP:   HT:5' 9"$  (175.3 cm)   WT:160 lb (72.6 kg)  BMI:23.62 PHYSICAL EXAM: Left Knee: - Inspection: no gross deformity. No swelling/effusion, erythema or bruising. Skin intact - Palpation: Slight TTP to medial knee joint. No TTP to anterior knee.  - ROM: full active ROM with flexion and extension in knee and hip - Strength: 5/5 strength - Neuro/vasc: NV intact - Special Tests: - LIGAMENTS: negative anterior and posterior drawer, no MCL or LCL laxity  -- MENISCUS: Mild discomfort with Thessaly.   ASSESSMENT & PLAN: See problem based charting & AVS for pt instructions.  L knee pain, improving - reassuring knee XR and physical exam. Patient is progressing well with conservative measures, will defer further imaging or any injection. Recommended knee strengthening exercises while continuing activity as  tolerated. Follow up as kneeded.   Zachary Horn, MS4 [facility] of Medicine  Patient seen and evaluated with the medical student.  I agree with the above plan of care.  Given Zachary Horn overall improvement, we will not pursue any further workup or treatment at this time.  He understands that we can reconsider merits of MRI or cortisone injection down the road if necessary.  Follow-up as needed.  This note was dictated using Dragon naturally speaking software and may contain errors in syntax, spelling, or content which have not been identified prior to signing this note.

## 2023-12-16 DIAGNOSIS — G609 Hereditary and idiopathic neuropathy, unspecified: Secondary | ICD-10-CM | POA: Diagnosis not present

## 2023-12-16 DIAGNOSIS — R311 Benign essential microscopic hematuria: Secondary | ICD-10-CM | POA: Diagnosis not present

## 2023-12-16 DIAGNOSIS — Z0000 Encounter for general adult medical examination without abnormal findings: Secondary | ICD-10-CM | POA: Diagnosis not present

## 2023-12-16 DIAGNOSIS — Z125 Encounter for screening for malignant neoplasm of prostate: Secondary | ICD-10-CM | POA: Diagnosis not present

## 2023-12-16 DIAGNOSIS — E782 Mixed hyperlipidemia: Secondary | ICD-10-CM | POA: Diagnosis not present

## 2023-12-18 DIAGNOSIS — K5100 Ulcerative (chronic) pancolitis without complications: Secondary | ICD-10-CM | POA: Diagnosis not present

## 2023-12-22 DIAGNOSIS — G609 Hereditary and idiopathic neuropathy, unspecified: Secondary | ICD-10-CM | POA: Diagnosis not present

## 2023-12-22 DIAGNOSIS — R311 Benign essential microscopic hematuria: Secondary | ICD-10-CM | POA: Diagnosis not present

## 2023-12-22 DIAGNOSIS — K51011 Ulcerative (chronic) pancolitis with rectal bleeding: Secondary | ICD-10-CM | POA: Diagnosis not present

## 2023-12-22 DIAGNOSIS — M359 Systemic involvement of connective tissue, unspecified: Secondary | ICD-10-CM | POA: Diagnosis not present

## 2023-12-22 DIAGNOSIS — E782 Mixed hyperlipidemia: Secondary | ICD-10-CM | POA: Diagnosis not present

## 2023-12-22 DIAGNOSIS — Z23 Encounter for immunization: Secondary | ICD-10-CM | POA: Diagnosis not present

## 2023-12-22 DIAGNOSIS — Z0000 Encounter for general adult medical examination without abnormal findings: Secondary | ICD-10-CM | POA: Diagnosis not present

## 2024-01-26 DIAGNOSIS — R351 Nocturia: Secondary | ICD-10-CM | POA: Diagnosis not present

## 2024-01-26 DIAGNOSIS — N401 Enlarged prostate with lower urinary tract symptoms: Secondary | ICD-10-CM | POA: Diagnosis not present

## 2024-01-26 DIAGNOSIS — R311 Benign essential microscopic hematuria: Secondary | ICD-10-CM | POA: Diagnosis not present

## 2024-03-31 DIAGNOSIS — R5383 Other fatigue: Secondary | ICD-10-CM | POA: Diagnosis not present

## 2024-03-31 DIAGNOSIS — R0602 Shortness of breath: Secondary | ICD-10-CM | POA: Diagnosis not present

## 2024-07-15 DIAGNOSIS — K5100 Ulcerative (chronic) pancolitis without complications: Secondary | ICD-10-CM | POA: Diagnosis not present

## 2024-07-15 DIAGNOSIS — E559 Vitamin D deficiency, unspecified: Secondary | ICD-10-CM | POA: Diagnosis not present
